# Patient Record
Sex: Female | Born: 1945 | Hispanic: Yes | Smoking: Never smoker
Health system: Southern US, Community
[De-identification: ages and names within clinical notes are randomized; demographics above are authoritative.]

## PROBLEM LIST (undated history)

## (undated) DIAGNOSIS — Z972 Presence of dental prosthetic device (complete) (partial): Secondary | ICD-10-CM

## (undated) DIAGNOSIS — I10 Essential (primary) hypertension: Secondary | ICD-10-CM

## (undated) DIAGNOSIS — R150 Incomplete defecation: Secondary | ICD-10-CM

## (undated) DIAGNOSIS — E119 Type 2 diabetes mellitus without complications: Secondary | ICD-10-CM

## (undated) DIAGNOSIS — E785 Hyperlipidemia, unspecified: Secondary | ICD-10-CM

## (undated) DIAGNOSIS — M1990 Unspecified osteoarthritis, unspecified site: Secondary | ICD-10-CM

## (undated) DIAGNOSIS — R32 Unspecified urinary incontinence: Secondary | ICD-10-CM

## (undated) DIAGNOSIS — F0390 Unspecified dementia without behavioral disturbance: Secondary | ICD-10-CM

## (undated) DIAGNOSIS — R1310 Dysphagia, unspecified: Secondary | ICD-10-CM

## (undated) HISTORY — DX: Incomplete defecation: R15.0

## (undated) HISTORY — DX: Dysphagia, unspecified: R13.10

## (undated) HISTORY — DX: Essential (primary) hypertension: I10

## (undated) HISTORY — DX: Unspecified urinary incontinence: R32

## (undated) HISTORY — DX: Unspecified osteoarthritis, unspecified site: M19.90

## (undated) HISTORY — PX: TUBAL LIGATION: SHX77

## (undated) HISTORY — DX: Hyperlipidemia, unspecified: E78.5

## (undated) HISTORY — DX: Type 2 diabetes mellitus without complications: E11.9

---

## 2004-11-06 ENCOUNTER — Ambulatory Visit: Payer: Self-pay

## 2005-08-22 ENCOUNTER — Ambulatory Visit: Payer: Self-pay | Admitting: Nurse Practitioner

## 2005-09-16 ENCOUNTER — Ambulatory Visit: Payer: Self-pay | Admitting: *Deleted

## 2006-04-20 ENCOUNTER — Emergency Department: Payer: Self-pay | Admitting: Emergency Medicine

## 2006-05-27 ENCOUNTER — Ambulatory Visit: Payer: Self-pay

## 2006-07-07 ENCOUNTER — Encounter: Payer: Self-pay | Admitting: *Deleted

## 2006-07-29 ENCOUNTER — Encounter: Payer: Self-pay | Admitting: *Deleted

## 2007-06-23 ENCOUNTER — Ambulatory Visit: Payer: Self-pay

## 2007-07-04 ENCOUNTER — Emergency Department: Payer: Self-pay | Admitting: Emergency Medicine

## 2007-08-16 ENCOUNTER — Emergency Department: Payer: Self-pay | Admitting: Emergency Medicine

## 2007-08-25 IMAGING — CR DG SHOULDER 3+V*R*
1 series · 3 of 3 positions shown · non-contrast
Comparison: none

REASON FOR EXAM: Fall
COMMENTS:   LMP: Post-Menopausal

PROCEDURE:     DXR - DXR SHOULDER RIGHT COMPLETE  - August 16, 2007 [DATE]
RESULT:     There is no evidence of fracture, dislocation or radiopaque
foreign body. Degenerative changes are noted including the acromioclavicular
joint.

[Series 1: view not recorded · 0.17mm/px · 3 of 3 slices shown]
[im 1/3]
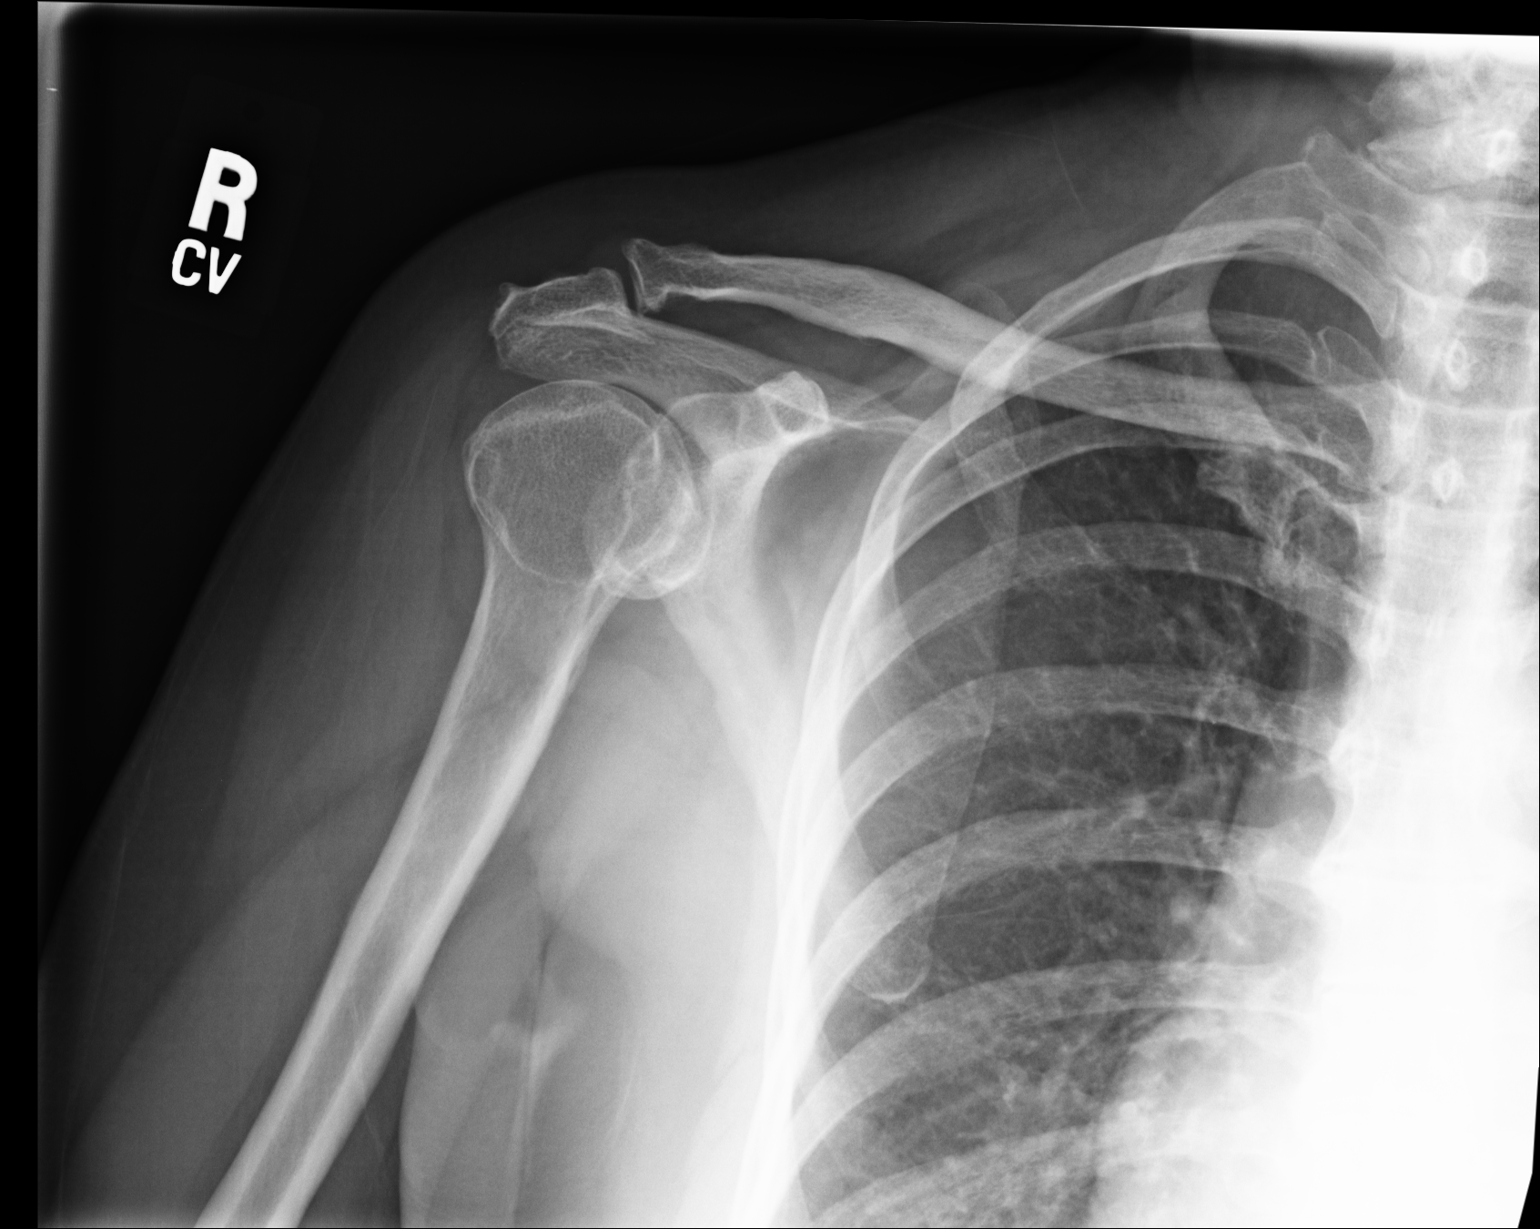
[im 2/3]
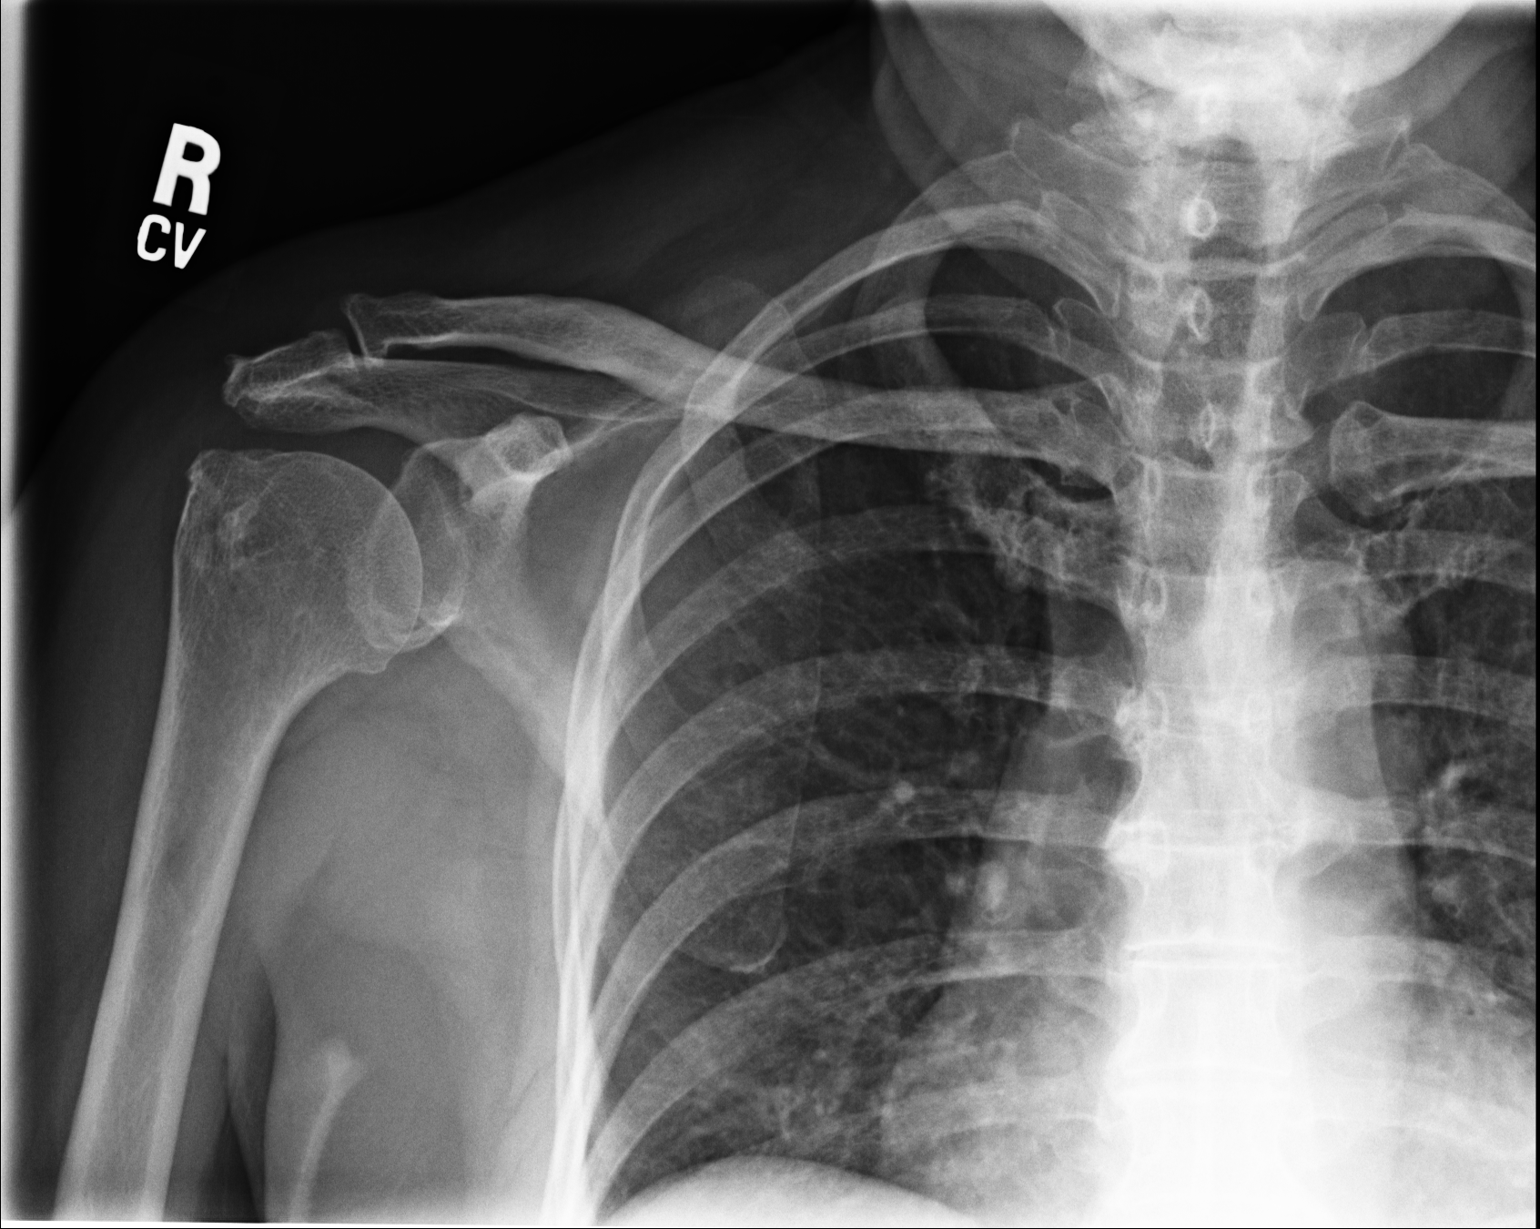
[im 3/3]
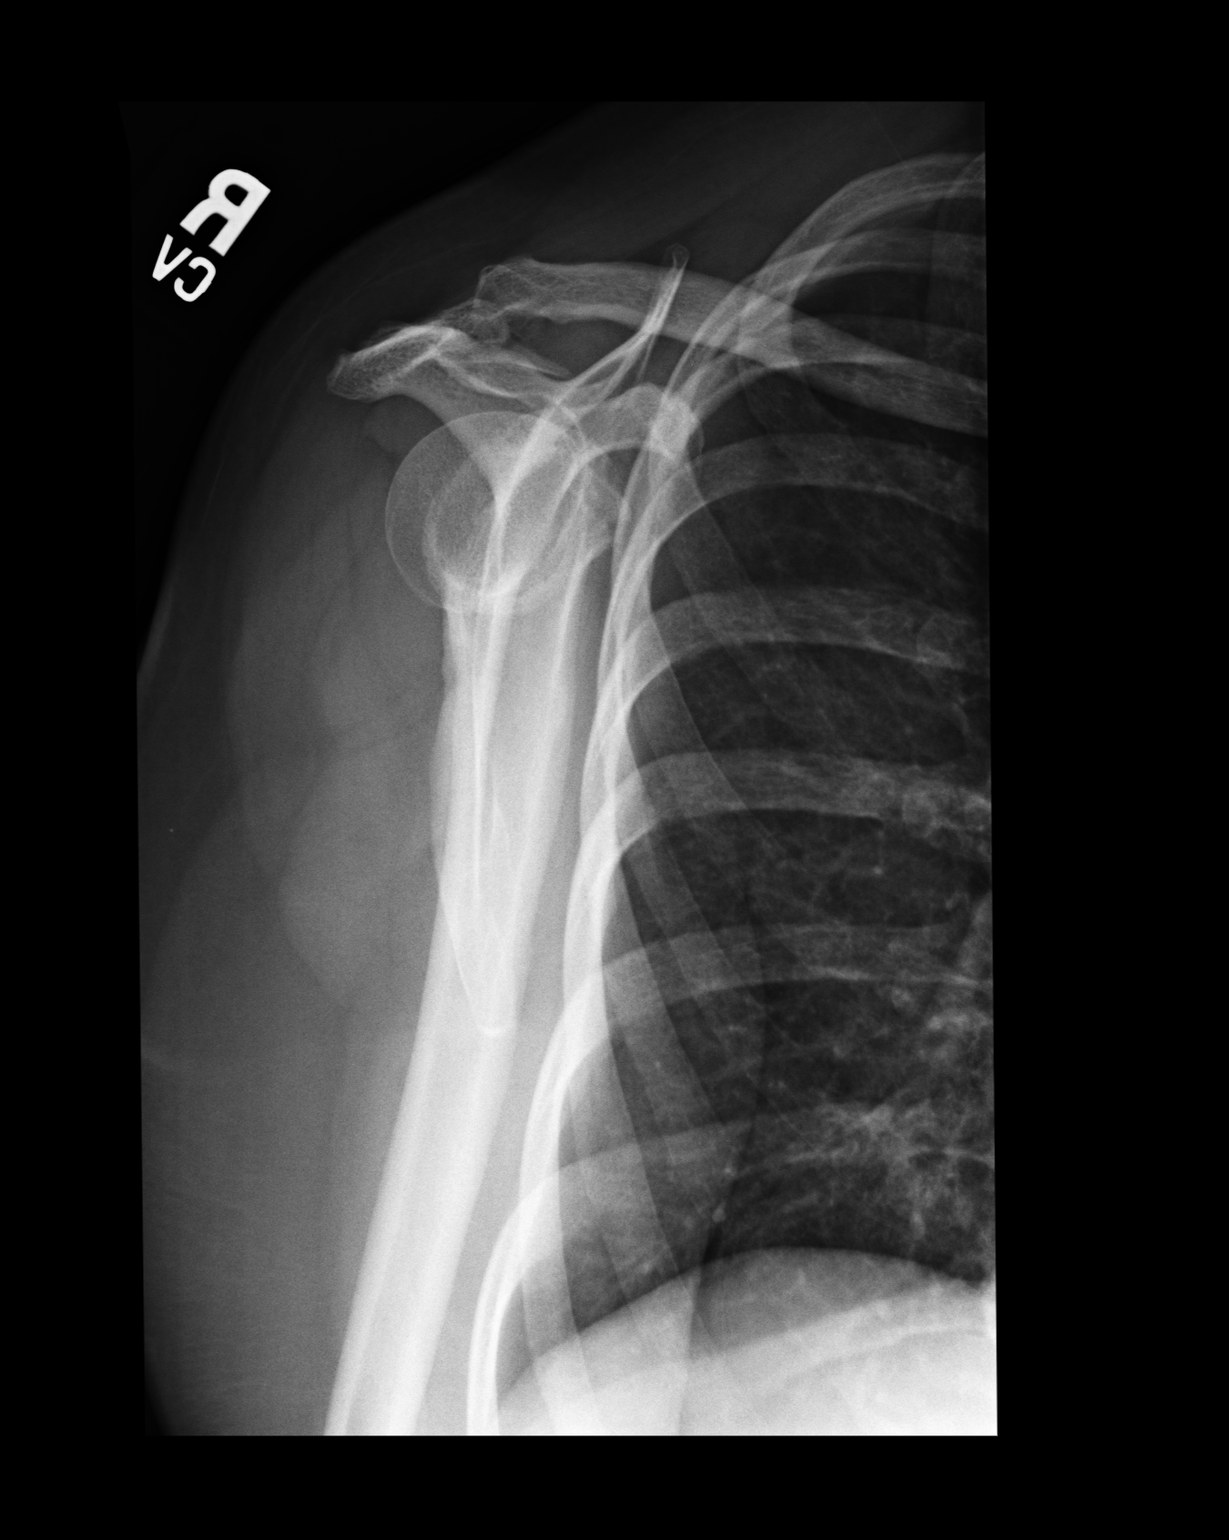

[3 of 3 positions shown; findings below may reference images not displayed]

IMPRESSION: 1.     No acute bony abnormality.
2.     Degenerative changes are noted.

## 2007-08-25 IMAGING — CR CERVICAL SPINE - 2-3 VIEW
1 series · 4 of 4 positions shown · non-contrast
Comparison: none

REASON FOR EXAM: Fall
COMMENTS:   LMP: Post-Menopausal

[Series 1: view not recorded · 0.17mm/px · 4 of 4 slices shown]
[im 1/4]
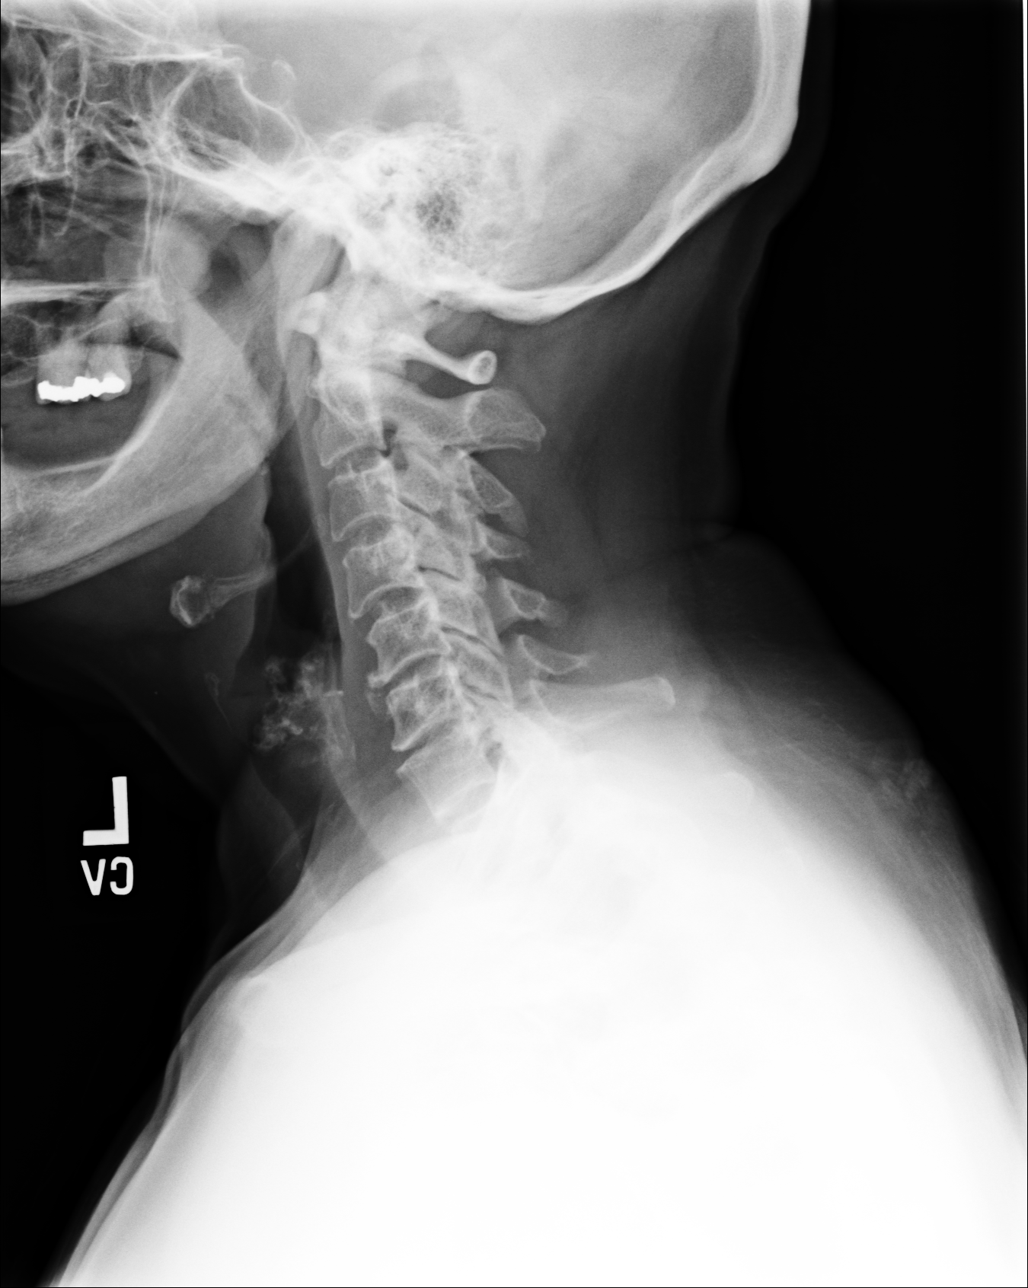
[im 2/4]
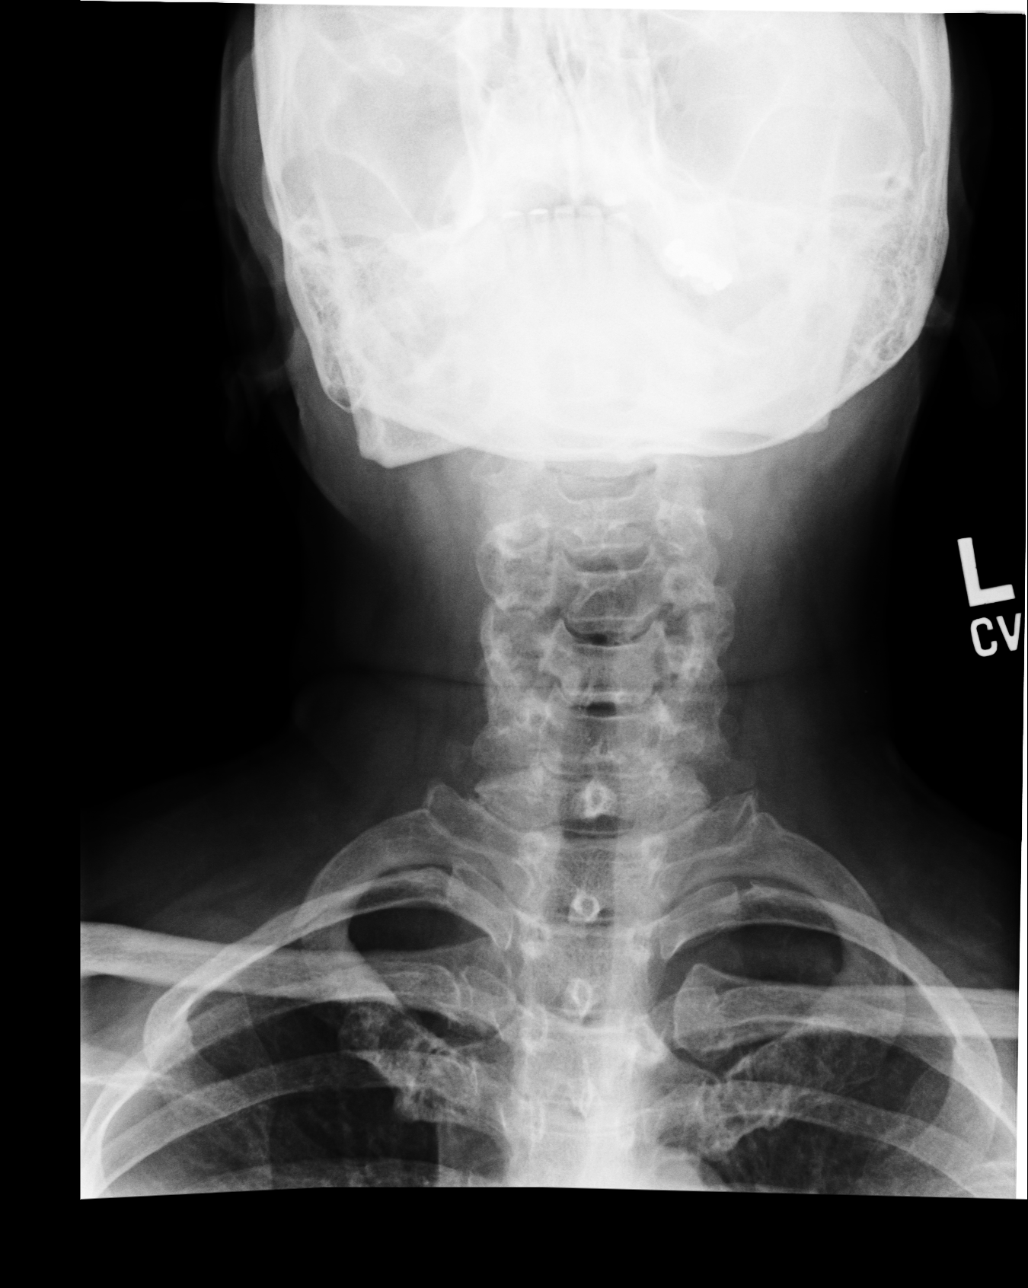
[im 3/4]
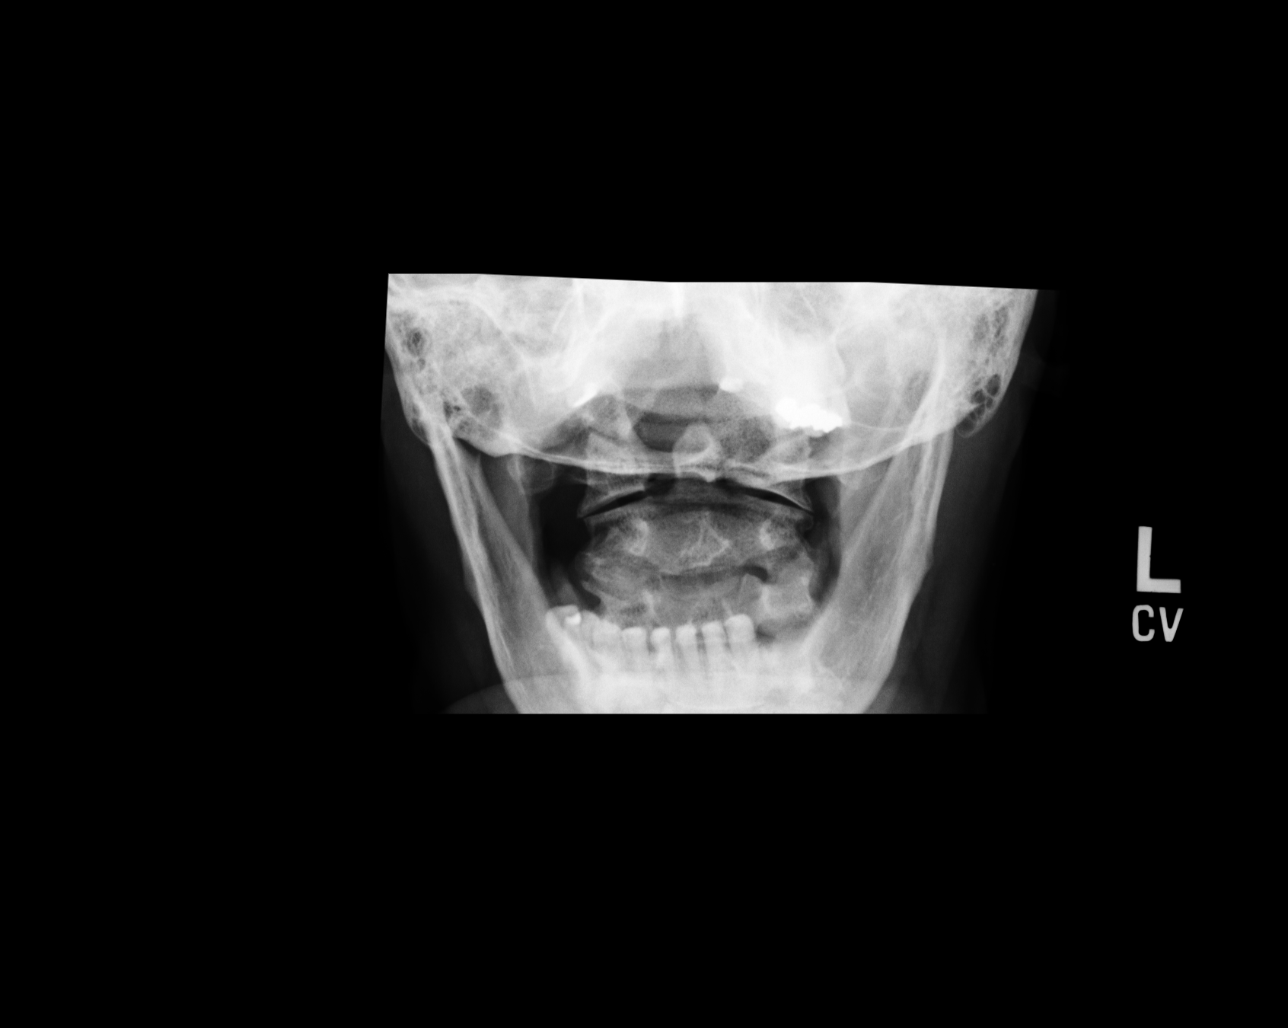
[im 4/4]
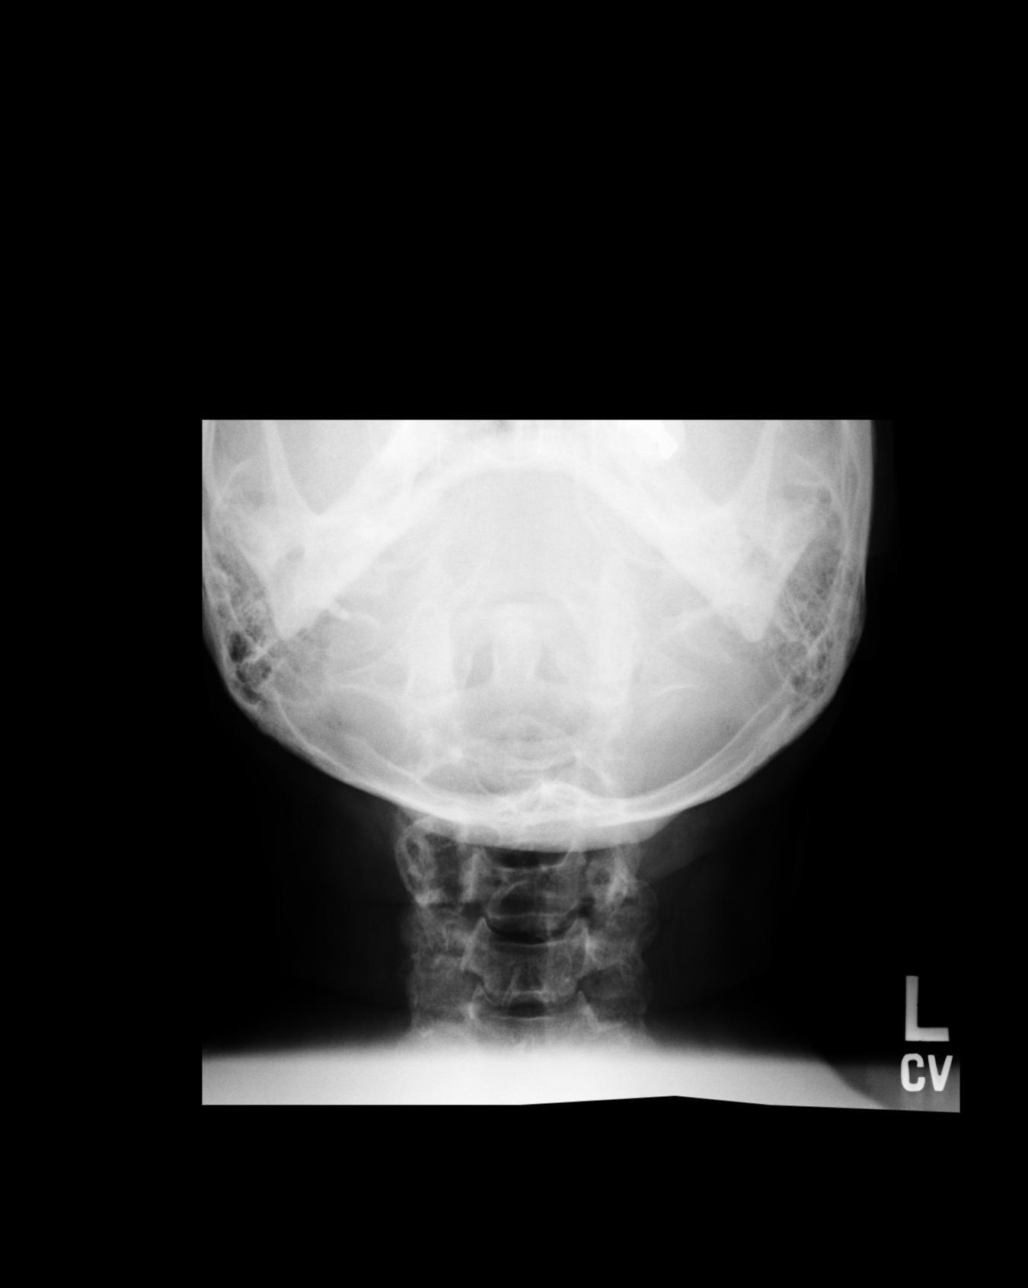

[4 of 4 positions shown; findings below may reference images not displayed]

PROCEDURE:     DXR - DXR C- SPINE AP AND LATERAL  - August 16, 2007 [DATE]

RESULT:     Comparison is made to images dated 08/22/2005.

There is degenerative intervertebral disc space narrowing at C5-6 and C6-7
with anterior end-plate spurring at these levels. The prevertebral soft
tissues are normal. The C1-C2 articulation and craniocervical junction
appear to be normal. The odontoid appears intact.
IMPRESSION: Degenerative changes are noted similar to that seen on
prior exam. No acute bony abnormality evident.

## 2008-03-16 ENCOUNTER — Ambulatory Visit: Payer: Self-pay | Admitting: Family Medicine

## 2008-11-26 ENCOUNTER — Emergency Department: Payer: Self-pay | Admitting: Emergency Medicine

## 2008-11-29 ENCOUNTER — Ambulatory Visit: Payer: Self-pay

## 2009-06-07 ENCOUNTER — Ambulatory Visit: Payer: Self-pay

## 2009-06-20 ENCOUNTER — Emergency Department: Payer: Self-pay | Admitting: Emergency Medicine

## 2009-06-24 ENCOUNTER — Emergency Department: Payer: Self-pay | Admitting: Emergency Medicine

## 2009-07-06 ENCOUNTER — Ambulatory Visit: Payer: Self-pay

## 2009-07-16 ENCOUNTER — Emergency Department: Payer: Self-pay | Admitting: Emergency Medicine

## 2010-08-05 ENCOUNTER — Ambulatory Visit: Payer: Self-pay | Admitting: Family Medicine

## 2010-10-09 ENCOUNTER — Ambulatory Visit: Payer: Self-pay | Admitting: Family Medicine

## 2011-08-07 ENCOUNTER — Ambulatory Visit: Payer: Self-pay | Admitting: Family Medicine

## 2012-09-03 ENCOUNTER — Ambulatory Visit: Payer: Self-pay | Admitting: Family Medicine

## 2013-11-02 ENCOUNTER — Ambulatory Visit: Payer: Self-pay | Admitting: Nurse Practitioner

## 2014-10-05 ENCOUNTER — Ambulatory Visit: Payer: Self-pay | Admitting: Nurse Practitioner

## 2014-11-06 ENCOUNTER — Ambulatory Visit: Payer: Self-pay | Admitting: Family Medicine

## 2015-02-28 DIAGNOSIS — I340 Nonrheumatic mitral (valve) insufficiency: Secondary | ICD-10-CM

## 2015-02-28 HISTORY — DX: Nonrheumatic mitral (valve) insufficiency: I34.0

## 2015-12-07 DIAGNOSIS — I10 Essential (primary) hypertension: Secondary | ICD-10-CM

## 2015-12-07 HISTORY — DX: Essential (primary) hypertension: I10

## 2016-02-12 ENCOUNTER — Emergency Department: Payer: Medicare Other

## 2016-02-12 ENCOUNTER — Emergency Department
Admission: EM | Admit: 2016-02-12 | Discharge: 2016-02-12 | Disposition: A | Discharge status: Home / Self Care | Payer: Medicare Other | Attending: Emergency Medicine | Admitting: Emergency Medicine

## 2016-02-12 DIAGNOSIS — R05 Cough: Secondary | ICD-10-CM

## 2016-02-12 DIAGNOSIS — Z79899 Other long term (current) drug therapy: Secondary | ICD-10-CM | POA: Diagnosis not present

## 2016-02-12 DIAGNOSIS — Z794 Long term (current) use of insulin: Secondary | ICD-10-CM | POA: Insufficient documentation

## 2016-02-12 DIAGNOSIS — R059 Cough, unspecified: Secondary | ICD-10-CM

## 2016-02-12 DIAGNOSIS — R109 Unspecified abdominal pain: Secondary | ICD-10-CM

## 2016-02-12 DIAGNOSIS — R1011 Right upper quadrant pain: Secondary | ICD-10-CM

## 2016-02-12 DIAGNOSIS — Z7982 Long term (current) use of aspirin: Secondary | ICD-10-CM | POA: Insufficient documentation

## 2016-02-12 LAB — URINALYSIS COMPLETE WITH MICROSCOPIC (ARMC ONLY)
BILIRUBIN URINE: NEGATIVE
Bacteria, UA: NONE SEEN
Glucose, UA: NEGATIVE mg/dL
KETONES UR: NEGATIVE mg/dL
Leukocytes, UA: NEGATIVE
NITRITE: NEGATIVE
Protein, ur: 30 mg/dL — AB
SPECIFIC GRAVITY, URINE: 1.024 (ref 1.005–1.030)
pH: 5 (ref 5.0–8.0)

## 2016-02-12 LAB — CBC WITH DIFFERENTIAL/PLATELET
BASOS ABS: 0.1 10*3/uL (ref 0–0.1)
BASOS PCT: 1 %
EOS ABS: 0.4 10*3/uL (ref 0–0.7)
Eosinophils Relative: 6 %
HCT: 39.4 % (ref 35.0–47.0)
HEMOGLOBIN: 13.1 g/dL (ref 12.0–16.0)
Lymphocytes Relative: 26 %
Lymphs Abs: 1.7 10*3/uL (ref 1.0–3.6)
MCH: 30.1 pg (ref 26.0–34.0)
MCHC: 33.2 g/dL (ref 32.0–36.0)
MCV: 90.9 fL (ref 80.0–100.0)
MONOS PCT: 8 %
Monocytes Absolute: 0.5 10*3/uL (ref 0.2–0.9)
NEUTROS ABS: 3.9 10*3/uL (ref 1.4–6.5)
NEUTROS PCT: 59 %
Platelets: 239 10*3/uL (ref 150–440)
RBC: 4.34 MIL/uL (ref 3.80–5.20)
RDW: 14 % (ref 11.5–14.5)
WBC: 6.6 10*3/uL (ref 3.6–11.0)

## 2016-02-12 LAB — COMPREHENSIVE METABOLIC PANEL
ALK PHOS: 122 U/L (ref 38–126)
ALT: 73 U/L — AB (ref 14–54)
ANION GAP: 5 (ref 5–15)
AST: 72 U/L — ABNORMAL HIGH (ref 15–41)
Albumin: 4.3 g/dL (ref 3.5–5.0)
BILIRUBIN TOTAL: 0.7 mg/dL (ref 0.3–1.2)
BUN: 13 mg/dL (ref 6–20)
CALCIUM: 8.4 mg/dL — AB (ref 8.9–10.3)
CO2: 26 mmol/L (ref 22–32)
CREATININE: 0.47 mg/dL (ref 0.44–1.00)
Chloride: 101 mmol/L (ref 101–111)
Glucose, Bld: 157 mg/dL — ABNORMAL HIGH (ref 65–99)
Potassium: 3.6 mmol/L (ref 3.5–5.1)
SODIUM: 132 mmol/L — AB (ref 135–145)
TOTAL PROTEIN: 8.1 g/dL (ref 6.5–8.1)

## 2016-02-12 LAB — LIPASE, BLOOD: LIPASE: 30 U/L (ref 11–51)

## 2016-02-12 MED ORDER — KETOROLAC TROMETHAMINE 30 MG/ML IJ SOLN
15.0000 mg | Freq: Once | INTRAMUSCULAR | Status: AC
  Administered 2016-02-12: 15 mg via INTRAVENOUS
  Filled 2016-02-12: qty 1

## 2016-02-12 MED ORDER — MORPHINE SULFATE (PF) 4 MG/ML IV SOLN
4.0000 mg | Freq: Once | INTRAVENOUS | Status: AC
  Administered 2016-02-12: 4 mg via INTRAVENOUS
  Filled 2016-02-12: qty 1

## 2016-02-12 MED ORDER — ONDANSETRON HCL 4 MG/2ML IJ SOLN
4.0000 mg | Freq: Once | INTRAMUSCULAR | Status: AC
  Administered 2016-02-12: 4 mg via INTRAVENOUS
  Filled 2016-02-12: qty 2

## 2016-02-12 MED ORDER — AZITHROMYCIN 250 MG PO TABS: ORAL_TABLET | ORAL | Status: AC

## 2016-02-12 MED ORDER — OXYCODONE-ACETAMINOPHEN 5-325 MG PO TABS: 1.0000 | ORAL_TABLET | Freq: Four times a day (QID) | ORAL | Status: DC | PRN

## 2016-02-12 MED ORDER — SODIUM CHLORIDE 0.9 % IV BOLUS (SEPSIS)
500.0000 mL | Freq: Once | INTRAVENOUS | Status: AC
  Administered 2016-02-12: 500 mL via INTRAVENOUS

## 2016-02-12 MED ORDER — NAPROXEN 250 MG PO TABS: 250.0000 mg | ORAL_TABLET | Freq: Two times a day (BID) | ORAL | Status: DC

## 2016-02-12 NOTE — ED Provider Notes (Addendum)
[facility] Emergency Department Provider Note  ____________________________________________   I have reviewed the triage vital signs and the nursing notes.   HISTORY  Chief Complaint Flank Pain    HPI Holly Schneider is a 70 y.o. female with a history of diabetes and high blood pressure, history is per patient with the help of the interpreter, patient has developed right-sided flank pain since yesterday. No abdominal pain. No nausea no vomiting. Her only other complaint is a cough. She's had this for a few days as well. She denies fever or chills. She denies dysuria or urinary frequency. She denies constipation or diarrhea. She states the pain began gradually last night. She does not have a history of kidney stones. Nothing makes it better nothing makes it worse. She does not believe it to be related to her coughing. It is below her ribs. She has no abdominal pain.  No past medical history on file.  There are no active problems to display for this patient.   No past surgical history on file.  Current Outpatient Rx  Name  Route  Sig  Dispense  Refill  . aspirin EC 81 MG tablet   Oral   Take 81 mg by mouth daily.          Marland Kitchen LANTUS SOLOSTAR 100 UNIT/ML Solostar Pen   Subcutaneous   Inject 16 Units into the skin at bedtime.            Dispense as written.   Marland Kitchen lisinopril (PRINIVIL,ZESTRIL) 20 MG tablet   Oral   Take 20 mg by mouth daily.          Marland Kitchen azithromycin (ZITHROMAX Z-PAK) 250 MG tablet      Take 2 tablets (500 mg) on  Day 1,  followed by 1 tablet (250 mg) once daily on Days 2 through 5.   6 each   0   . naproxen (NAPROSYN) 250 MG tablet   Oral   Take 1 tablet (250 mg total) by mouth 2 (two) times daily with a meal.   10 tablet   0   . oxyCODONE-acetaminophen (ROXICET) 5-325 MG tablet   Oral   Take 1 tablet by mouth every 6 (six) hours as needed.   8 tablet   0     Allergies Review of patient's allergies indicates no known  allergies.  No family history on file.  Social History Social History  Substance Use Topics  . Smoking status: Not on file  . Smokeless tobacco: Not on file  . Alcohol Use: Not on file    Review of Systems Constitutional: No fever/chills Eyes: No visual changes. ENT: No sore throat. No stiff neck no neck pain Cardiovascular: Denies chest pain. Respiratory: Denies shortness of breath. Positive for cough Gastrointestinal:   no vomiting.  No diarrhea.  No constipation. Genitourinary: Negative for dysuria. Musculoskeletal: Negative lower extremity swelling Skin: Negative for rash. Neurological: Negative for headaches, focal weakness or numbness. 10-point ROS otherwise negative.  ____________________________________________   PHYSICAL EXAM:  VITAL SIGNS: ED Triage Vitals  Enc Vitals Group     BP 02/12/16 0719 145/70 mmHg     Pulse Rate 02/12/16 0719 62     Resp 02/12/16 0719 18     Temp 02/12/16 0719 98 F (36.7 C)     Temp Source 02/12/16 0719 Oral     SpO2 02/12/16 0719 99 %     Weight 02/12/16 0719 120 lb (54.432 kg)     Height 02/12/16  0719 4\' 11"  (1.499 m)     Head Cir --      Peak Flow --      Pain Score 02/12/16 0720 8     Pain Loc --      Pain Edu? --      Excl. in GC? --     Constitutional: Alert and oriented. Well appearing and in no acute distress. Eyes: Conjunctivae are normal. PERRL. EOMI. Head: Atraumatic. Nose: No congestion/rhinnorhea. Mouth/Throat: Mucous membranes are moist.  Oropharynx non-erythematous. Neck: No stridor.   Nontender with no meningismus Cardiovascular: Normal rate, regular rhythm. Grossly normal heart sounds.  Good peripheral circulation. Respiratory: Normal respiratory effort.  No retractions. Lungs CTAB. Abdominal: Soft and nontender. No distention. No guarding no rebound Back:  There is no focal tenderness or step off there is no midline tenderness there are no lesions noted. there is right CVA tenderness Musculoskeletal: No  lower extremity tenderness. No joint effusions, no DVT signs strong distal pulses no edema Neurologic:  Normal speech and language. No gross focal neurologic deficits are appreciated.  Skin:  Skin is warm, dry and intact. No rash noted. Psychiatric: Mood and affect are normal. Speech and behavior are normal.  ____________________________________________   LABS (all labs ordered are listed, but only abnormal results are displayed)  Labs Reviewed  COMPREHENSIVE METABOLIC PANEL - Abnormal; Notable for the following:    Sodium 132 (*)    Glucose, Bld 157 (*)    Calcium 8.4 (*)    AST 72 (*)    ALT 73 (*)    All other components within normal limits  URINALYSIS COMPLETEWITH MICROSCOPIC ([facility] ONLY) - Abnormal; Notable for the following:    Color, Urine YELLOW (*)    APPearance CLEAR (*)    Hgb urine dipstick 2+ (*)    Protein, ur 30 (*)    Squamous Epithelial / LPF 0-5 (*)    All other components within normal limits  URINE CULTURE  CBC WITH DIFFERENTIAL/PLATELET  LIPASE, BLOOD   ____________________________________________  EKG  I personally interpreted any EKGs ordered by me or triage  ____________________________________________  RADIOLOGY  I reviewed any imaging ordered by me or triage that were performed during my shift ____________________________________________   PROCEDURES  Procedure(s) performed: None  Critical Care performed: None  ____________________________________________   INITIAL IMPRESSION / ASSESSMENT AND PLAN / ED COURSE  Pertinent labs & imaging results that were available during my care of the patient were reviewed by me and considered in my medical decision making (see chart for details).  Well-appearing woman with reproducible right flank pain that does seem to be either muscular skeletal or possibly renal in origin. There is no significant abdominal pain. There is no significant rib pain noted. Differential could certainly be muscular  skeletal injury from coughing, kidney stone is possible pyelonephritis is possible. Lower suspicion for pneumonia or other primary pulmonary pathology as a direct cause of this pain however we will obtain a chest x-ray and I'll obtain a CT scan of that area.  ----------------------------------------- 9:32 AM on 02/12/2016 -----------------------------------------  Patient does have a mild bump in her LFTs, and there is a gallstone noted on CT, however, patient does not have any significant right upper quadrant pain. She still has a new palpation in the right flank. Most likely I feel this is possibly musculoskeletal from her cough there is no evidence of pneumonia, do not believe she has a PE. Very reproducible pain. However, she does have hematuria she could've passed  a small kidney stone as well. Our workup is very reassuring in terms of acute life-threatening disease processes at this time. We will get a ultrasound of her gallbladder although she does not have significant tenderness given her elevation in liver function tests and her possible gallstone. Sometimes she can have referred pain but again this is quite reproducible.  ----------------------------------------- 12:30 PM on 02/12/2016 ----------------------------------------- Patient remains very well-appearing. The only time she has pain is when she changes position. She states she believes that she hurt herself coughing at this time there is nothing to indicate that is not true. I do not think a CTA of the chest is indicated for this patient at this time. I do not believe this is a PE. His very reproducible muscular skeletal pain without any other obvious pathology noted on extensive encumbrance of workup. She is very comfortable especially after Toradol unless she moves. Patient prefer to go home at this time. She does continue to have a slight cough that there is no evidence of pneumonia on CT scan to the extent that her bases are  appreciated nor is there any on chest x-ray. Given her age however and her significant cough I may consider antibiotics for her and pain medication. Return precautions and follow-up given and understood. No evidence of broken rib clinically. A shin is able to take deep breath without any significant difficulty. No evidence of splinting.  ____________________________________________   FINAL CLINICAL IMPRESSION(S) / ED DIAGNOSES  Final diagnoses:  Flank pain  RUQ pain  Cough      This chart was dictated using voice recognition software.  Despite best efforts to proofread,  errors can occur which can change meaning.     Jeanmarie Plant, MD 02/12/16 2130  Jeanmarie Plant, MD 02/12/16 [id number]  Jeanmarie Plant, MD 02/12/16 1233  Jeanmarie Plant, MD 02/12/16 [phone]

## 2016-02-12 NOTE — Discharge Instructions (Signed)
Dolor abdominal en adultos °(Abdominal Pain, Adult) °El dolor puede tener muchas causas. Normalmente la causa del dolor abdominal no es una enfermedad y mejorará sin tratamiento. Frecuentemente puede controlarse y tratarse en casa. Su médico le realizará un examen físico y posiblemente solicite análisis de sangre y radiografías para ayudar a determinar la gravedad de su dolor. Sin embargo, en muchos casos, debe transcurrir más tiempo antes de que se pueda encontrar una causa evidente del dolor. Antes de llegar a ese punto, es posible que su médico no sepa si necesita más pruebas o un tratamiento más profundo. °INSTRUCCIONES PARA EL CUIDADO EN EL HOGAR  °Esté atento al dolor para ver si hay cambios. Las siguientes indicaciones ayudarán a aliviar cualquier molestia que pueda sentir: °· Tome solo medicamentos de venta libre o recetados, según las indicaciones del médico. °· No tome laxantes a menos que se lo haya indicado su médico. °· Pruebe con una dieta líquida absoluta (caldo, té o agua) según se lo indique su médico. Introduzca gradualmente una dieta normal, según su tolerancia. °SOLICITE ATENCIÓN MÉDICA SI: °· Tiene dolor abdominal sin explicación. °· Tiene dolor abdominal relacionado con náuseas o diarrea. °· Tiene dolor cuando orina o defeca. °· Experimenta dolor abdominal que lo despierta de noche. °· Tiene dolor abdominal que empeora o mejora cuando come alimentos. °· Tiene dolor abdominal que empeora cuando come alimentos grasosos. °· Tiene fiebre. °SOLICITE ATENCIÓN MÉDICA DE INMEDIATO SI:  °· El dolor no desaparece en un plazo máximo de 2 horas. °· No deja de (vomitar). °· El dolor se siente solo en partes del abdomen, como el lado derecho o la parte inferior izquierda del abdomen. °· Evacúa materia fecal sanguinolenta o negra, de aspecto alquitranado. °ASEGÚRESE DE QUE: °· Comprende estas instrucciones. °· Controlará su afección. °· Recibirá ayuda de inmediato si no mejora o si empeora. °  °Esta  información no tiene como fin reemplazar el consejo del médico. Asegúrese de hacerle al médico cualquier pregunta que tenga. °  °Document Released: 12/15/2005 Document Revised: 01/05/2015 °Elsevier Interactive Patient Education ©2016 [employer] ° °

## 2016-02-12 NOTE — ED Notes (Signed)
Discussed discharge instructions, prescriptions, and follow-up care with patient. No questions or concerns at this time. Pt stable at discharge. Spanish interpretation provided by Wilford Corner.

## 2016-02-12 NOTE — ED Notes (Addendum)
Pt to ED c/o R flank pain 8/10, tender to palpation. No n/v/d. No c/o dysuria. Assessment done via spanish interpreter [employer].

## 2016-02-12 NOTE — ED Notes (Signed)
Pt reports she began yesterday having rt sided flank pain. Denies dysuria, denies nvd. No fever

## 2016-02-13 LAB — URINE CULTURE

## 2016-04-03 ENCOUNTER — Other Ambulatory Visit: Payer: Self-pay | Admitting: Family Medicine

## 2016-04-03 DIAGNOSIS — Z780 Asymptomatic menopausal state: Secondary | ICD-10-CM

## 2016-04-03 DIAGNOSIS — Z1231 Encounter for screening mammogram for malignant neoplasm of breast: Secondary | ICD-10-CM

## 2016-05-29 ENCOUNTER — Telehealth: Payer: Self-pay | Admitting: Urology

## 2016-05-29 ENCOUNTER — Encounter: Payer: Self-pay | Admitting: Urology

## 2016-05-29 ENCOUNTER — Ambulatory Visit (INDEPENDENT_AMBULATORY_CARE_PROVIDER_SITE_OTHER): Payer: Medicare Other | Admitting: Urology

## 2016-05-29 VITALS — BP 133/74 | HR 76 | Ht <= 58 in | Wt 120.2 lb

## 2016-05-29 DIAGNOSIS — R351 Nocturia: Secondary | ICD-10-CM

## 2016-05-29 DIAGNOSIS — R32 Unspecified urinary incontinence: Secondary | ICD-10-CM

## 2016-05-29 DIAGNOSIS — R350 Frequency of micturition: Secondary | ICD-10-CM

## 2016-05-29 DIAGNOSIS — N952 Postmenopausal atrophic vaginitis: Secondary | ICD-10-CM

## 2016-05-29 DIAGNOSIS — IMO0001 Reserved for inherently not codable concepts without codable children: Secondary | ICD-10-CM

## 2016-05-29 DIAGNOSIS — R310 Gross hematuria: Secondary | ICD-10-CM

## 2016-05-29 LAB — MICROSCOPIC EXAMINATION: BACTERIA UA: NONE SEEN

## 2016-05-29 LAB — URINALYSIS, COMPLETE
Bilirubin, UA: NEGATIVE
GLUCOSE, UA: NEGATIVE
KETONES UA: NEGATIVE
NITRITE UA: NEGATIVE
Protein, UA: NEGATIVE
SPEC GRAV UA: 1.015 (ref 1.005–1.030)
Urobilinogen, Ur: 0.2 mg/dL (ref 0.2–1.0)
pH, UA: 5.5 (ref 5.0–7.5)

## 2016-05-29 LAB — BLADDER SCAN AMB NON-IMAGING: Scan Result: 35

## 2016-05-29 MED ORDER — ESTROGENS, CONJUGATED 0.625 MG/GM VA CREA: 1.0000 | TOPICAL_CREAM | Freq: Every day | VAGINAL | Status: DC

## 2016-05-29 MED ORDER — ESTRADIOL 0.1 MG/GM VA CREA: TOPICAL_CREAM | VAGINAL | Status: DC

## 2016-05-29 NOTE — Progress Notes (Signed)
05/29/2016 10:47 AM   Jacklynn Bue [birth year] [id number]  Referring provider: No referring provider defined for this encounter.  Chief Complaint  Patient presents with  . Urinary Incontinence    referred by [facility]    HPI: Patient is a 70 -year-old Hispanic female who presents today as a referral from their PCP, Dr. Celedonio Miyamoto , for mixed urinary incontinence.    She presents with an interpreter, Genella Rife.    Patient was found to have microscopic hematuria on today's visit with 3-10 RBC's/hpf.  Patient does have a prior history of gross hematuria about one month ago.  She does not have a prior history of recurrent urinary tract infections, nephrolithiasis, trauma to the genitourinary tract or malignancies of the genitourinary tract.   She does not have a family medical history of nephrolithiasis, malignancies of the genitourinary tract or hematuria.   Today, she is having symptoms of frequent urination (5 to 6 times a day) and nocturia (4 times a night).  She denies dysuria, incontinence, hesitancy, intermittency, straining to urinate or a weak urinary stream.    She is not experiencing any suprapubic pain, abdominal pain or flank pain. She denies any recent fevers, chills, nausea or vomiting.   She had a recent CT Renal Stone study on 02/12/2016 for right sided flank pain, but no renal calculi or obstructive changes are noted.  Right renal cyst.  No other focal abnormality is noted.  She is not a smoker.   She was exposed to secondhand smoke.  She has worked with Personnel officer.    PMH: Past Medical History  Diagnosis Date  . Arthritis   . Diabetes (HCC)   . HTN (hypertension)   . HLD (hyperlipidemia)   . Urinary incontinence     Surgical History: Past Surgical History  Procedure Laterality Date  . Tubal ligation      Home Medications:    Medication List       This list is accurate as of: 05/29/16 10:47 AM.  Always  use your most recent med list.               aspirin EC 81 MG tablet  Take 81 mg by mouth daily.     atorvastatin 40 MG tablet  Commonly known as:  LIPITOR     conjugated estrogens vaginal cream  Commonly known as:  PREMARIN  Place 1 Applicatorful vaginally daily. Apply 0.5mg  (pea-sized amount)  just inside the vaginal introitus with a finger-tip every night for two weeks and then Monday, Wednesday and Friday nights.     estradiol 0.1 MG/GM vaginal cream  Commonly known as:  ESTRACE VAGINAL  Apply 0.5mg  (pea-sized amount)  just inside the vaginal introitus with a finger-tip every night for two weeks and then Monday, Wednesday and Friday nights.     LANTUS SOLOSTAR 100 UNIT/ML Solostar Pen  Generic drug:  Insulin Glargine  Inject 16 Units into the skin at bedtime.     lisinopril 20 MG tablet  Commonly known as:  PRINIVIL,ZESTRIL  Take 20 mg by mouth daily.     metFORMIN 500 MG tablet  Commonly known as:  GLUCOPHAGE     naproxen 250 MG tablet  Commonly known as:  NAPROSYN  Take 1 tablet (250 mg total) by mouth 2 (two) times daily with a meal.     oxyCODONE-acetaminophen 5-325 MG tablet  Commonly known as:  ROXICET  Take 1 tablet by mouth every 6 (six) hours as needed.  Allergies: No Known Allergies  Family History: Family History  Problem Relation Age of Onset  . Kidney disease Neg Hx     Social History:  reports that she has never smoked. She does not have any smokeless tobacco history on file. She reports that she does not drink alcohol or use illicit drugs.  ROS: UROLOGY Frequent Urination?: Yes Hard to postpone urination?: Yes Burning/pain with urination?: No Get up at night to urinate?: Yes Leakage of urine?: No Urine stream starts and stops?: No Trouble starting stream?: No Do you have to strain to urinate?: No Blood in urine?: Yes Urinary tract infection?: No Sexually transmitted disease?: No Injury to kidneys or bladder?: No Painful  intercourse?: No Weak stream?: No Currently pregnant?: No Vaginal bleeding?: No Last menstrual period?: n  Gastrointestinal Nausea?: No Vomiting?: No Indigestion/heartburn?: No Diarrhea?: No Constipation?: No  Constitutional Fever: No Night sweats?: No Weight loss?: No Fatigue?: No  Skin Skin rash/lesions?: No Itching?: No  Eyes Blurred vision?: No Double vision?: No  Ears/Nose/Throat Sore throat?: No Sinus problems?: No  Hematologic/Lymphatic Swollen glands?: No Easy bruising?: No  Cardiovascular Leg swelling?: No Chest pain?: No  Respiratory Cough?: No Shortness of breath?: No  Endocrine Excessive thirst?: No  Musculoskeletal Back pain?: Yes Joint pain?: No  Neurological Headaches?: No Dizziness?: Yes  Psychologic Depression?: No Anxiety?: No  Physical Exam: BP 133/74 mmHg  Pulse 76  Ht 4\' 9"  (1.448 m)  Wt 120 lb 3.2 oz (54.522 kg)  BMI 26.00 kg/m2  Constitutional: Well nourished. Alert and oriented, No acute distress. HEENT: [place] AT, moist mucus membranes. Trachea midline, no masses. Cardiovascular: No clubbing, cyanosis, or edema. Respiratory: Normal respiratory effort, no increased work of breathing. GI: Abdomen is soft, non tender, non distended, no abdominal masses. Liver and spleen not palpable.  No hernias appreciated.  Stool sample for occult testing is not indicated.   GU: No CVA tenderness.  No bladder fullness or masses.  Atrophic external genitalia, normal pubic hair distribution, no lesions.  Normal urethral meatus, no lesions, no prolapse, no discharge.   No urethral masses, tenderness and/or tenderness. No bladder fullness, tenderness or masses. Normal vagina mucosa, good estrogen effect, no discharge, no lesions, good pelvic support, Grade II cystocele is noted.  No rectocele is noted.  No cervical motion tenderness.  Uterus is freely mobile and non-fixed.  No adnexal/parametria masses or tenderness noted.  Anus and perineum are  without rashes or lesions. Skin: No rashes, bruises or suspicious lesions. Lymph: No cervical or inguinal adenopathy. Neurologic: Grossly intact, no focal deficits, moving all 4 extremities. Psychiatric: Normal mood and affect.  Laboratory Data: Lab Results  Component Value Date   WBC 6.6 02/12/2016   HGB 13.1 02/12/2016   HCT 39.4 02/12/2016   MCV 90.9 02/12/2016   PLT 239 02/12/2016    Lab Results  Component Value Date   CREATININE 0.47 02/12/2016    Lab Results  Component Value Date   AST 72* 02/12/2016   Lab Results  Component Value Date   ALT 73* 02/12/2016      Urinalysis Results for orders placed or performed in visit on 05/29/16  CULTURE, URINE COMPREHENSIVE  Result Value Ref Range   Urine Culture, Comprehensive Final report    Result 1 Comment   Microscopic Examination  Result Value Ref Range   WBC, UA 6-10 (A) 0 -  5 /hpf   RBC, UA 3-10 (A) 0 -  2 /hpf   Epithelial Cells (non renal) 0-10 0 -  10 /hpf   Bacteria, UA None seen None seen/Few  Urinalysis, Complete  Result Value Ref Range   Specific Gravity, UA 1.015 1.005 - 1.030   pH, UA 5.5 5.0 - 7.5   Color, UA Yellow Yellow   Appearance Ur Clear Clear   Leukocytes, UA Trace (A) Negative   Protein, UA Negative Negative/Trace   Glucose, UA Negative Negative   Ketones, UA Negative Negative   RBC, UA 2+ (A) Negative   Bilirubin, UA Negative Negative   Urobilinogen, Ur 0.2 0.2 - 1.0 mg/dL   Nitrite, UA Negative Negative   Microscopic Examination See below:   BUN+Creat  Result Value Ref Range   BUN 13 8 - 27 mg/dL   Creatinine, Ser 6.96 0.57 - 1.00 mg/dL   GFR calc non Af Amer 94 >59 mL/min/1.73   GFR calc Af Amer 108 >59 mL/min/1.73   BUN/Creatinine Ratio 22 12 - 28  BLADDER SCAN AMB NON-IMAGING  Result Value Ref Range   Scan Result 35     Pertinent Imaging: CLINICAL DATA: Right flank pain for 1 day  EXAM: CT ABDOMEN AND PELVIS WITHOUT CONTRAST  TECHNIQUE: Multidetector CT imaging of  the abdomen and pelvis was performed following the standard protocol without IV contrast.  COMPARISON: None.  FINDINGS: Lung bases are free of acute infiltrate or sizable effusion.  The liver, spleen, adrenal glands and pancreas are within normal limits. The gallbladder is well distended. A single dependent density is noted likely representing small gallstone. No complicating factors are seen. The kidneys are well visualized bilaterally and reveal no renal calculi. A hypodensity is noted in the medial aspect of the right kidney likely representing a cyst. No obstructive changes are noted.  The appendix is within normal limits. The bladder is partially distended. No pelvic mass lesion is noted. Diverticular change without diverticulitis is noted. The osseous structures are within normal limits.  IMPRESSION: No renal calculi or obstructive changes are noted.  Right renal cyst.  No other focal abnormality is noted.   Electronically Signed  By: Alcide Clever M.D.  On: 02/12/2016 08:26  Results for MARIZOL, BORROR (MRN [id number]) as of 05/29/2016 09:45  Ref. Range 05/29/2016 09:20  Scan Result Unknown 35   Assessment & Plan:    1. Gross hematuria:    I explained to the patient that there are a number of causes that can be associated with blood in the urine, such as stones, UTI's, damage to the urinary tract and/or cancer.  At this time, I felt that the patient warranted further urologic evaluation.   The AUA guidelines state that a CT urogram is the preferred imaging study to evaluate hematuria.  I explained to the patient that a contrast material will be injected into a vein and that in rare instances, an allergic reaction can result and may even life threatening   The patient denies any allergies to contrast, iodine and/or seafood and is taking metformin.  Her reproductive status is status post menopausal and she underwent a tubal ligation.   Following the  imaging study,  I've recommended a cystoscopy. I described how this is performed, typically in an office setting with a flexible cystoscope. We described the risks, benefits, and possible side effects, the most common of which is a minor amount of blood in the urine and/or burning which usually resolves in 24 to 48 hours. I also gave the patient the option of sending a urine for cytologic evaluation.  The patient had the opportunity to ask  questions which were answered. Based upon this discussion, the patient is willing to proceed. Therefore, I've ordered: a CT Urogram with  and cystoscopy.  She will return following all of the above for discussion of the results.     - Urinalysis, Complete - CULTURE, URINE COMPREHENSIVE  2. Urinary frequency:  If no etiology is found for her hematuria or frequency during her work up, we will reexamine this issue.    - BLADDER SCAN AMB NON-IMAGING  3. Urinary nocturia:  If no etiology is found for her hematuria or nocturia during her work up, we will reexamine this issue.    4. Vaginal atrophy:   I explained to the patient that when women go through menopause and her estrogen levels are severely diminished, the normal vaginal flora will change.  This is due to an increase of the vaginal canal's pH. Because of this, the vaginal canal may be colonized by bacteria from the rectum instead of the protective lactobacillus.  This accompanied by the loss of the mucus barrier with vaginal atrophy is a cause of recurrent urinary tract infections.  It may also cause the irritative bladder symptoms she is experiencing.    Patient was given a sample of vaginal estrogen cream (Estrace) and instructed to apply 0.5mg  (pea-sized amount)  just inside the vaginal introitus with a finger-tip every night for two weeks and then Monday, Wednesday and Friday nights.  I explained to the patient that vaginally administered estrogen, which causes only a slight increase in the blood estrogen  levels, have fewer contraindications and adverse systemic effects that oral HT.  I have also given prescriptions for the Estrace cream and Premarin cream, so that the patient may carry them to the pharmacy to see which one of the branded creams would be most economical for her.  She will have this readdressed when she completes her hematuria work up.    Return for CT Urogram report and cystoscopy.  These notes generated with voice recognition software. I apologize for typographical errors.  Michiel Cowboy, PA-C  [facility] [address], [address] [place], [place] [address] [phone]

## 2016-05-29 NOTE — Telephone Encounter (Signed)
Would you please send a copy of this office visit to the patient's referring provider, Dr. Celedonio MiyamotoAndrian Mancheno?

## 2016-05-30 LAB — BUN+CREAT
BUN/Creatinine Ratio: 22 (ref 12–28)
BUN: 13 mg/dL (ref 8–27)
CREATININE: 0.58 mg/dL (ref 0.57–1.00)
GFR, EST AFRICAN AMERICAN: 108 mL/min/{1.73_m2} (ref >59)
GFR, EST NON AFRICAN AMERICAN: 94 mL/min/{1.73_m2} (ref >59)

## 2016-05-30 NOTE — Telephone Encounter (Signed)
Done ° ° °Holly Schneider °

## 2016-05-31 LAB — CULTURE, URINE COMPREHENSIVE

## 2016-06-02 ENCOUNTER — Ambulatory Visit: Payer: Medicare Other | Attending: Family Medicine

## 2016-06-02 ENCOUNTER — Other Ambulatory Visit: Payer: Medicare Other

## 2016-06-03 DIAGNOSIS — N952 Postmenopausal atrophic vaginitis: Secondary | ICD-10-CM

## 2016-06-03 HISTORY — DX: Postmenopausal atrophic vaginitis: N95.2

## 2016-06-17 ENCOUNTER — Ambulatory Visit (INDEPENDENT_AMBULATORY_CARE_PROVIDER_SITE_OTHER): Payer: Medicare Other | Admitting: Urology

## 2016-06-17 VITALS — BP 122/70 | HR 73 | Ht <= 58 in | Wt 122.0 lb

## 2016-06-17 DIAGNOSIS — R310 Gross hematuria: Secondary | ICD-10-CM | POA: Diagnosis not present

## 2016-06-17 LAB — URINALYSIS, COMPLETE
Bilirubin, UA: NEGATIVE
GLUCOSE, UA: NEGATIVE
Ketones, UA: NEGATIVE
NITRITE UA: NEGATIVE
PH UA: 5.5 (ref 5.0–7.5)
Protein, UA: NEGATIVE
Specific Gravity, UA: 1.02 (ref 1.005–1.030)
UUROB: 0.2 mg/dL (ref 0.2–1.0)

## 2016-06-17 LAB — MICROSCOPIC EXAMINATION: Epithelial Cells (non renal): >10 /hpf — AB (ref 0–10)

## 2016-06-17 MED ORDER — CIPROFLOXACIN HCL 500 MG PO TABS
500.0000 mg | ORAL_TABLET | Freq: Once | ORAL | Status: AC
  Administered 2016-06-17: 500 mg via ORAL

## 2016-06-17 MED ORDER — LIDOCAINE HCL 2 % EX GEL
1.0000 "application " | Freq: Once | CUTANEOUS | Status: AC
  Administered 2016-06-17: 1 via URETHRAL

## 2016-06-17 NOTE — Procedures (Signed)
    Cystoscopy Procedure Note  Patient identification was confirmed, informed consent was obtained, and patient was prepped using Betadine solution.  Lidocaine jelly was administered per urethral meatus.    Preoperative abx where received prior to procedure.    Procedure: - Flexible cystoscope introduced, without any difficulty.   - Thorough search of the bladder revealed:    Urethral meatus stenosis - required dilation in order to accommodate the cystoscope.    normal urothelium    no stones    no ulcers     no tumors    no urethral polyps    no trabeculation  - Ureteral orifices were normal in position and appearance.  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  Post-Procedure: - Patient tolerated the procedure well  Plan: RTC after CT scan.

## 2016-06-23 ENCOUNTER — Ambulatory Visit
Admission: RE | Admit: 2016-06-23 | Discharge: 2016-06-23 | Disposition: A | Discharge status: Home / Self Care | Payer: Medicare Other | Source: Ambulatory Visit | Attending: Urology | Admitting: Urology

## 2016-06-23 DIAGNOSIS — R310 Gross hematuria: Secondary | ICD-10-CM | POA: Insufficient documentation

## 2016-06-23 DIAGNOSIS — K760 Fatty (change of) liver, not elsewhere classified: Secondary | ICD-10-CM | POA: Diagnosis not present

## 2016-06-23 MED ORDER — IOPAMIDOL (ISOVUE-370) INJECTION 76%
100.0000 mL | Freq: Once | INTRAVENOUS | Status: AC | PRN
  Administered 2016-06-23: 100 mL via INTRAVENOUS

## 2016-06-26 ENCOUNTER — Encounter: Payer: Self-pay | Admitting: Urology

## 2016-06-26 ENCOUNTER — Ambulatory Visit: Payer: Medicare Other | Admitting: Urology

## 2016-07-07 ENCOUNTER — Telehealth: Payer: Self-pay

## 2016-07-07 NOTE — Telephone Encounter (Signed)
No answer

## 2016-07-07 NOTE — Telephone Encounter (Signed)
-----   Message from Harle BattiestShannon A McGowan, PA-C sent at 06/29/2016  6:06 PM EDT ----- Please notify the patient that her CT scan was negative. Also send a copy of this to her primary care physician. She will need an appointment in 1 year for a recheck.

## 2016-07-15 NOTE — Telephone Encounter (Signed)
No answer

## 2016-07-18 NOTE — Telephone Encounter (Signed)
No answer. Will send a letter.  

## 2017-07-20 ENCOUNTER — Encounter: Payer: Self-pay | Admitting: *Deleted

## 2017-07-20 ENCOUNTER — Emergency Department
Admission: EM | Admit: 2017-07-20 | Discharge: 2017-07-20 | Disposition: A | Discharge status: Home / Self Care | Payer: Medicare Other | Attending: Emergency Medicine | Admitting: Emergency Medicine

## 2017-07-20 ENCOUNTER — Other Ambulatory Visit: Payer: Self-pay

## 2017-07-20 ENCOUNTER — Emergency Department: Payer: Medicare Other

## 2017-07-20 DIAGNOSIS — Y939 Activity, unspecified: Secondary | ICD-10-CM | POA: Insufficient documentation

## 2017-07-20 DIAGNOSIS — Y92019 Unspecified place in single-family (private) house as the place of occurrence of the external cause: Secondary | ICD-10-CM | POA: Insufficient documentation

## 2017-07-20 DIAGNOSIS — S7001XA Contusion of right hip, initial encounter: Secondary | ICD-10-CM | POA: Insufficient documentation

## 2017-07-20 DIAGNOSIS — E119 Type 2 diabetes mellitus without complications: Secondary | ICD-10-CM | POA: Insufficient documentation

## 2017-07-20 DIAGNOSIS — W010XXA Fall on same level from slipping, tripping and stumbling without subsequent striking against object, initial encounter: Secondary | ICD-10-CM | POA: Diagnosis not present

## 2017-07-20 DIAGNOSIS — Z7984 Long term (current) use of oral hypoglycemic drugs: Secondary | ICD-10-CM | POA: Diagnosis not present

## 2017-07-20 DIAGNOSIS — Y999 Unspecified external cause status: Secondary | ICD-10-CM | POA: Diagnosis not present

## 2017-07-20 DIAGNOSIS — Z7982 Long term (current) use of aspirin: Secondary | ICD-10-CM | POA: Diagnosis not present

## 2017-07-20 DIAGNOSIS — I10 Essential (primary) hypertension: Secondary | ICD-10-CM | POA: Diagnosis not present

## 2017-07-20 DIAGNOSIS — Z79899 Other long term (current) drug therapy: Secondary | ICD-10-CM | POA: Insufficient documentation

## 2017-07-20 DIAGNOSIS — W19XXXA Unspecified fall, initial encounter: Secondary | ICD-10-CM

## 2017-07-20 DIAGNOSIS — S79911A Unspecified injury of right hip, initial encounter: Secondary | ICD-10-CM | POA: Diagnosis present
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
## 2017-07-20 NOTE — ED Notes (Signed)
See triage note  states she tripped over her own feet  And fell  Having pain to right hip area  Ambulates with slight limp d/t pain

## 2017-07-20 NOTE — ED Triage Notes (Signed)
Per Interpreter, patient states she fell two days ago and c/o right buttock/hip pain. Patient states she tripped over her feet. Patient  Denies hitting her head.

## 2017-10-28 ENCOUNTER — Other Ambulatory Visit: Payer: Self-pay | Admitting: Student

## 2017-10-28 DIAGNOSIS — R945 Abnormal results of liver function studies: Principal | ICD-10-CM

## 2017-10-28 DIAGNOSIS — R7989 Other specified abnormal findings of blood chemistry: Secondary | ICD-10-CM

## 2017-11-02 ENCOUNTER — Other Ambulatory Visit: Payer: Self-pay | Admitting: Student

## 2017-11-02 DIAGNOSIS — R7989 Other specified abnormal findings of blood chemistry: Secondary | ICD-10-CM

## 2017-11-02 DIAGNOSIS — K760 Fatty (change of) liver, not elsewhere classified: Secondary | ICD-10-CM

## 2017-11-02 DIAGNOSIS — R945 Abnormal results of liver function studies: Principal | ICD-10-CM

## 2017-11-03 ENCOUNTER — Other Ambulatory Visit: Payer: Self-pay | Admitting: Student

## 2017-11-03 DIAGNOSIS — R7989 Other specified abnormal findings of blood chemistry: Secondary | ICD-10-CM

## 2017-11-03 DIAGNOSIS — R945 Abnormal results of liver function studies: Principal | ICD-10-CM

## 2017-11-03 DIAGNOSIS — K760 Fatty (change of) liver, not elsewhere classified: Secondary | ICD-10-CM

## 2017-11-16 ENCOUNTER — Ambulatory Visit
Admission: RE | Admit: 2017-11-16 | Discharge: 2017-11-16 | Disposition: A | Discharge status: Home / Self Care | Payer: Medicare Other | Source: Ambulatory Visit | Attending: Student | Admitting: Student

## 2017-11-16 ENCOUNTER — Ambulatory Visit: Payer: Medicare Other

## 2017-11-16 DIAGNOSIS — K760 Fatty (change of) liver, not elsewhere classified: Secondary | ICD-10-CM | POA: Diagnosis not present

## 2017-11-16 DIAGNOSIS — R7989 Other specified abnormal findings of blood chemistry: Secondary | ICD-10-CM | POA: Diagnosis present

## 2017-11-16 DIAGNOSIS — R945 Abnormal results of liver function studies: Secondary | ICD-10-CM

## 2018-04-13 ENCOUNTER — Other Ambulatory Visit: Payer: Self-pay | Admitting: Family Medicine

## 2018-04-13 DIAGNOSIS — Z1239 Encounter for other screening for malignant neoplasm of breast: Secondary | ICD-10-CM

## 2018-04-13 DIAGNOSIS — Z1231 Encounter for screening mammogram for malignant neoplasm of breast: Secondary | ICD-10-CM

## 2018-04-19 ENCOUNTER — Other Ambulatory Visit: Payer: Self-pay | Admitting: Internal Medicine

## 2018-04-19 ENCOUNTER — Other Ambulatory Visit: Payer: Self-pay | Admitting: Student

## 2018-04-19 DIAGNOSIS — R945 Abnormal results of liver function studies: Secondary | ICD-10-CM

## 2018-04-19 DIAGNOSIS — K7400 Hepatic fibrosis, unspecified: Secondary | ICD-10-CM

## 2018-04-19 DIAGNOSIS — R7989 Other specified abnormal findings of blood chemistry: Secondary | ICD-10-CM

## 2018-04-26 ENCOUNTER — Ambulatory Visit
Admission: RE | Admit: 2018-04-26 | Discharge: 2018-04-26 | Disposition: A | Discharge status: Home / Self Care | Payer: Medicare Other | Source: Ambulatory Visit | Attending: Internal Medicine | Admitting: Internal Medicine

## 2018-04-26 DIAGNOSIS — R945 Abnormal results of liver function studies: Secondary | ICD-10-CM | POA: Insufficient documentation

## 2018-04-26 DIAGNOSIS — K760 Fatty (change of) liver, not elsewhere classified: Secondary | ICD-10-CM | POA: Diagnosis not present

## 2018-04-26 DIAGNOSIS — K7400 Hepatic fibrosis, unspecified: Secondary | ICD-10-CM

## 2018-04-26 DIAGNOSIS — R7989 Other specified abnormal findings of blood chemistry: Secondary | ICD-10-CM

## 2018-04-26 MED ORDER — GADOBENATE DIMEGLUMINE 529 MG/ML IV SOLN
10.0000 mL | Freq: Once | INTRAVENOUS | Status: AC | PRN
  Administered 2018-04-26: 10 mL via INTRAVENOUS

## 2018-06-29 ENCOUNTER — Other Ambulatory Visit: Payer: Self-pay | Admitting: Family Medicine

## 2018-08-02 ENCOUNTER — Ambulatory Visit
Admission: RE | Admit: 2018-08-02 | Discharge: 2018-08-02 | Disposition: A | Discharge status: Home / Self Care | Payer: Medicare Other | Source: Ambulatory Visit | Attending: Family Medicine | Admitting: Family Medicine

## 2018-08-02 DIAGNOSIS — Z1231 Encounter for screening mammogram for malignant neoplasm of breast: Secondary | ICD-10-CM | POA: Insufficient documentation

## 2018-10-18 DIAGNOSIS — K7460 Unspecified cirrhosis of liver: Secondary | ICD-10-CM

## 2018-10-18 HISTORY — DX: Unspecified cirrhosis of liver: K74.60

## 2018-10-19 ENCOUNTER — Other Ambulatory Visit: Payer: Self-pay | Admitting: Family Medicine

## 2018-10-19 DIAGNOSIS — Z13820 Encounter for screening for osteoporosis: Secondary | ICD-10-CM

## 2018-11-01 ENCOUNTER — Inpatient Hospital Stay: Admit: 2018-11-01 | Payer: Self-pay

## 2018-11-01 ENCOUNTER — Ambulatory Visit
Admission: RE | Admit: 2018-11-01 | Discharge: 2018-11-01 | Disposition: A | Discharge status: Home / Self Care | Payer: Medicare Other | Source: Ambulatory Visit | Attending: Family Medicine | Admitting: Family Medicine

## 2018-11-01 ENCOUNTER — Other Ambulatory Visit: Admission: RE | Admit: 2018-11-01 | Payer: Self-pay | Source: Ambulatory Visit | Admitting: *Deleted

## 2018-11-01 ENCOUNTER — Other Ambulatory Visit: Payer: Self-pay | Admitting: Family Medicine

## 2018-11-01 DIAGNOSIS — M542 Cervicalgia: Secondary | ICD-10-CM

## 2018-11-01 DIAGNOSIS — M19011 Primary osteoarthritis, right shoulder: Secondary | ICD-10-CM | POA: Insufficient documentation

## 2018-11-01 DIAGNOSIS — M1990 Unspecified osteoarthritis, unspecified site: Secondary | ICD-10-CM | POA: Diagnosis present

## 2018-12-06 ENCOUNTER — Ambulatory Visit
Admission: RE | Admit: 2018-12-06 | Discharge: 2018-12-06 | Disposition: A | Discharge status: Home / Self Care | Payer: Medicare Other | Source: Ambulatory Visit | Attending: Family Medicine | Admitting: Family Medicine

## 2018-12-06 DIAGNOSIS — Z13820 Encounter for screening for osteoporosis: Secondary | ICD-10-CM | POA: Diagnosis present

## 2018-12-06 DIAGNOSIS — M810 Age-related osteoporosis without current pathological fracture: Secondary | ICD-10-CM | POA: Insufficient documentation

## 2019-02-08 ENCOUNTER — Other Ambulatory Visit: Payer: Self-pay | Admitting: Family Medicine

## 2019-02-08 DIAGNOSIS — G8929 Other chronic pain: Secondary | ICD-10-CM

## 2019-02-08 DIAGNOSIS — M542 Cervicalgia: Principal | ICD-10-CM

## 2019-02-16 ENCOUNTER — Ambulatory Visit: Admission: RE | Admit: 2019-02-16 | Payer: Medicare Other | Source: Ambulatory Visit

## 2019-09-28 ENCOUNTER — Ambulatory Visit
Admission: RE | Admit: 2019-09-28 | Discharge: 2019-09-28 | Disposition: A | Discharge status: Home / Self Care | Payer: Medicare Other | Source: Ambulatory Visit | Attending: Family Medicine | Admitting: Family Medicine

## 2019-09-28 ENCOUNTER — Other Ambulatory Visit: Payer: Self-pay | Admitting: Family Medicine

## 2019-09-28 DIAGNOSIS — R159 Full incontinence of feces: Secondary | ICD-10-CM | POA: Diagnosis not present

## 2019-11-06 ENCOUNTER — Encounter: Payer: Self-pay | Admitting: Emergency Medicine

## 2019-11-06 ENCOUNTER — Emergency Department: Payer: Medicare Other

## 2019-11-06 ENCOUNTER — Other Ambulatory Visit: Payer: Self-pay

## 2019-11-06 ENCOUNTER — Emergency Department
Admission: EM | Admit: 2019-11-06 | Discharge: 2019-11-06 | Disposition: A | Discharge status: Home / Self Care | Payer: Medicare Other | Attending: Emergency Medicine | Admitting: Emergency Medicine

## 2019-11-06 DIAGNOSIS — Z7982 Long term (current) use of aspirin: Secondary | ICD-10-CM | POA: Diagnosis not present

## 2019-11-06 DIAGNOSIS — Z79899 Other long term (current) drug therapy: Secondary | ICD-10-CM | POA: Diagnosis not present

## 2019-11-06 DIAGNOSIS — M25572 Pain in left ankle and joints of left foot: Secondary | ICD-10-CM

## 2019-11-06 DIAGNOSIS — Z794 Long term (current) use of insulin: Secondary | ICD-10-CM | POA: Insufficient documentation

## 2019-11-06 DIAGNOSIS — I10 Essential (primary) hypertension: Secondary | ICD-10-CM | POA: Insufficient documentation

## 2019-11-06 DIAGNOSIS — E119 Type 2 diabetes mellitus without complications: Secondary | ICD-10-CM | POA: Insufficient documentation

## 2019-11-06 LAB — URIC ACID: Uric Acid, Serum: 4.5 mg/dL (ref 2.5–7.1)

## 2019-11-06 MED ORDER — NAPROXEN 500 MG PO TABS: 500.0000 mg | ORAL_TABLET | Freq: Two times a day (BID) | ORAL | 0 refills | Status: AC

## 2019-11-06 MED ORDER — TRAMADOL HCL 50 MG PO TABS: 50.0000 mg | ORAL_TABLET | Freq: Two times a day (BID) | ORAL | 0 refills | Status: DC

## 2019-11-06 MED ORDER — TRAMADOL HCL 50 MG PO TABS
50.0000 mg | ORAL_TABLET | Freq: Once | ORAL | Status: AC
  Administered 2019-11-06: 50 mg via ORAL
  Filled 2019-11-06: qty 1

## 2019-11-06 MED ORDER — NAPROXEN 500 MG PO TABS
500.0000 mg | ORAL_TABLET | Freq: Once | ORAL | Status: AC
  Administered 2019-11-06: 500 mg via ORAL
  Filled 2019-11-06: qty 1

## 2019-11-06 MED ORDER — WALKER MISC: 0 refills | Status: DC

## 2019-11-06 NOTE — ED Notes (Signed)
Interpreter Services requested

## 2019-11-06 NOTE — Discharge Instructions (Addendum)
Your exam, XR, and Uric Acid level are essentially normal at this time. Your ankle pain may be due to tendinitis or joint pain. There is no evidence of gout or a bone fracture. Take the prescription meds as directed for pain and inflammation. You may apply ice to reduce pain and swelling. Follow-up with Dr. Ladoris Gene for ongoing ankle pain.   Su examen, XR y nivel de cido rico son esencialmente normales en este momento. Su dolor de tobillo puede deberse a una tendinitis o dolor en las articulaciones. No hay evidencia de gota o fractura de hueso. Tome los medicamentos recetados segn las indicaciones para Conservation officer, historic buildings y la inflamacin. Puede aplicar hielo para reducir Conservation officer, historic buildings y la hinchazn. Haga un seguimiento con el Dr. Ladoris Gene para el dolor de tobillo continuo.

## 2019-11-06 NOTE — ED Provider Notes (Signed)
[facility] Emergency Department Provider Note ____________________________________________  Time seen: 1750  I have reviewed the triage vital signs and the nursing notes.  HISTORY  Chief Complaint  Joint Swelling and Ankle Pain  History as told to Merri Ray PA-S. History limited by [place] language.  Interpreter Marchelle Folks B.), present for the interview and exam.  HPI Holly Schneider is a 73 y.o. female reports onset at home at about 1 pm. She denies any injury preceding the onset of pain. She was working around the house and noted slow onset of pain. The reports with pain with walking and weightbearing. She denies any previous history of ankle or foot pain. 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  She localizes pain to the posterior lateral aspect of the ankle.  She reports some local swelling to the lateral ankle as well.  Past Medical History:  Diagnosis Date  . Arthritis   . Diabetes (HCC)   . HLD (hyperlipidemia)   . HTN (hypertension)   . Urinary incontinence     Patient Active Problem List   Diagnosis Date Noted  . Vaginal atrophy 06/03/2016    Past Surgical History:  Procedure Laterality Date  . TUBAL LIGATION      Prior to Admission medications   Medication Sig Start Date End Date Taking? Authorizing Provider  aspirin EC 81 MG tablet Take 81 mg by mouth daily.     [provider]  atorvastatin (LIPITOR) 40 MG tablet  05/19/16   [provider]  conjugated estrogens (PREMARIN) vaginal cream Place 1 Applicatorful vaginally daily. Apply 0.5mg  (pea-sized amount)  just inside the vaginal introitus with a finger-tip every night for two weeks and then Monday, Wednesday and Friday nights. 05/29/16   Michiel Cowboy A, PA-C  estradiol (ESTRACE VAGINAL) 0.1 MG/GM vaginal cream Apply 0.5mg  (pea-sized amount)  just inside the vaginal introitus with a finger-tip every night for two weeks and then Monday, Wednesday and Friday nights.  05/29/16   McGowan, Carollee Herter A, PA-C  LANTUS SOLOSTAR 100 UNIT/ML Solostar Pen Inject 16 Units into the skin at bedtime.  11/12/15   [provider]  lisinopril (PRINIVIL,ZESTRIL) 20 MG tablet Take 20 mg by mouth daily.  10/11/14   [provider]  metFORMIN (GLUCOPHAGE) 500 MG tablet  05/15/16   [provider]  Misc. Devices Dan Humphreys) MISC Standard Walker for ambulation 11/06/19   Syler Norcia, Charlesetta Ivory, PA-C  naproxen (NAPROSYN) 500 MG tablet Take 1 tablet (500 mg total) by mouth 2 (two) times daily with a meal for 15 days. 11/06/19 11/21/19  Dorine Duffey, Charlesetta Ivory, PA-C  traMADol (ULTRAM) 50 MG tablet Take 1 tablet (50 mg total) by mouth 2 (two) times daily. 11/06/19   Kaysia Willard, Charlesetta Ivory, PA-C    Allergies Patient has no known allergies.  Family History  Problem Relation Age of Onset  . Kidney disease Neg Hx     Social History Social History   Tobacco Use  . Smoking status: Never Smoker  . Smokeless tobacco: Never Used  Substance Use Topics  . Alcohol use: No    Alcohol/week: 0.0 standard drinks  . Drug use: No    Review of Systems  Constitutional: Negative for fever. Cardiovascular: Negative for chest pain. Respiratory: Negative for shortness of breath. Musculoskeletal: Negative for back pain. Left ankle pain as above. Skin: Negative for rash. Neurological: Negative for headaches, focal weakness or numbness. ____________________________________________  PHYSICAL EXAM:  VITAL SIGNS: ED Triage Vitals  Enc Vitals Group     BP 11/06/19 1721 (!) 147/75     Pulse Rate 11/06/19 1721 74     Resp 11/06/19 1721 16     Temp 11/06/19 1721 98.7 F (37.1 C)     Temp Source 11/06/19 1721 Oral     SpO2 11/06/19 1721 99 %     Weight 11/06/19 1718 118 lb (53.5 kg)     Height 11/06/19 1718 5\' 1"  (1.549 m)     Head Circumference --      Peak Flow --      Pain Score 11/06/19 1718 10     Pain Loc --      Pain Edu? --      Excl. in GC? --      Constitutional: Alert and oriented. Well appearing and in no distress. Head: Normocephalic and atraumatic. Eyes: Conjunctivae are normal. Normal extraocular movements Cardiovascular: Normal rate, regular rhythm. Normal distal pulses. Respiratory: Normal respiratory effort. No wheezes/rales/rhonchi. Gastrointestinal: Soft and nontender. No distention. Musculoskeletal: Left ankle without any obvious deformity, dislocation, or joint effusion.  Patient with subtle soft swelling noted to the lateral ankle at the distal malleolus.  Patient is also exquisitely tender to palpation to the same region.  Manipulation of the calf and Achilles not elicit any pain, but palpation of the Achilles insertion did cause some discomfort.  Normal ankle range of motion is appreciated.  Negative anterior/posterior drawer.  Nontender with normal range of motion in all extremities.  Neurologic:  Normal gait without ataxia. Normal speech and language. No gross focal neurologic deficits are appreciated. Skin:  Skin is warm, dry and intact. No rash noted. ____________________________________________  LABORATORY   Labs Reviewed  URIC ACID  _____________________________________________   RADIOLOGY  DG Left Ankle IMPRESSION: No fracture or dislocation of the left ankle. Joint spaces are preserved. Mild soft tissue edema over the lateral ankle.  I, Lissa HoardJenise V Bacon-Yesha Muchow, personally viewed and evaluated these images (plain radiographs) as part of my medical decision making, as well as reviewing the written report by the radiologist. ____________________________________________  PROCEDURES  Ultram 50 mg PO Naproxen 500 mg PO Ace bandage Procedures ____________________________________________  INITIAL IMPRESSION / ASSESSMENT AND PLAN / ED COURSE  Geriatric patient with ED evaluation of acute ankle pain on the left.  Patient's clinical picture concerning for possible inflammatory etiology including gout or  osteoarthritis.  Patient's uric acid is normal at this time.  She does not show any acute degenerative or erosive process.  She does have some minor bone spurs appreciated.  Clinical findings are more consistent with a mild tendinitis or ankle sprain as well as generalized joint pain.  Patient will be discharged with a prescription for naproxen as well as Ultram to take as needed.  She is also advised to follow-up with primary provider for ongoing symptoms.  A prescription for a standard walker is also provided to help patient ambulate with weightbearing as tolerated.  She will follow with primary provider or return to the ED as needed.  Holly Schneider was evaluated in Emergency Department on 11/07/2019 for the symptoms described in the history of present illness. She was evaluated in the context of the global COVID-19 pandemic, which necessitated consideration that the patient might be at risk for infection with the SARS-CoV-2 virus that causes COVID-19. Institutional protocols and algorithms that pertain to the evaluation of patients at risk for COVID-19 are in a state of rapid change based on information released by regulatory bodies including the  CDC and federal and [employer]. These policies and algorithms were followed during the patient's care in the ED. ____________________________________________  FINAL CLINICAL IMPRESSION(S) / ED DIAGNOSES  Final diagnoses:  Acute left ankle pain      Trevaris Pennella, Dannielle Karvonen, PA-C 11/07/19 0026    Nance Pear, MD 11/14/19 1504

## 2019-11-06 NOTE — ED Notes (Signed)
Interpreter requested for blood draw

## 2019-11-06 NOTE — ED Triage Notes (Signed)
Pt to ED via POV c/o left ankle pain and swelling. Pt denies any known injury. Pt is in NAD.

## 2019-11-07 ENCOUNTER — Ambulatory Visit (INDEPENDENT_AMBULATORY_CARE_PROVIDER_SITE_OTHER): Payer: Medicare Other | Admitting: Gastroenterology

## 2019-11-07 ENCOUNTER — Other Ambulatory Visit: Payer: Self-pay

## 2019-11-07 ENCOUNTER — Encounter: Payer: Self-pay | Admitting: Gastroenterology

## 2019-11-07 VITALS — BP 123/67 | HR 52 | Temp 97.9°F | Ht 61.0 in | Wt 118.0 lb

## 2019-11-07 DIAGNOSIS — R194 Change in bowel habit: Secondary | ICD-10-CM | POA: Diagnosis not present

## 2019-11-07 DIAGNOSIS — E782 Mixed hyperlipidemia: Secondary | ICD-10-CM | POA: Insufficient documentation

## 2019-11-07 DIAGNOSIS — K7460 Unspecified cirrhosis of liver: Secondary | ICD-10-CM | POA: Diagnosis not present

## 2019-11-07 DIAGNOSIS — R159 Full incontinence of feces: Secondary | ICD-10-CM

## 2019-11-07 HISTORY — DX: Mixed hyperlipidemia: E78.2

## 2019-11-07 NOTE — H&P (View-Only) (Signed)
Gastroenterology Consultation  Referring Provider:     Preston Fleeting* Primary Care Physician:  Preston Fleeting, MD Primary Gastroenterologist:  Dr. Servando Snare     Reason for Consultation:     Fecal incontinence        HPI:   Holly Schneider is a 73 y.o. y/o female referred for consultation & management of fecal incontinence by Dr. Neita Goodnight, [name], MD.  This patient comes in today after being seen in the past by Dr. Mechele Collin and his nurse practitioner for abnormal liver enzymes.  The patient's work-up for abnormal liver enzymes showed a increased alkaline phosphatase and fractionation of that showed that the majority of the alkaline phosphatase was coming from the small intestines.  The patient's liver enzymes have been elevated at least since 2018 when I look at her previous labs.  The patient did not have any signs of autoimmune hepatitis or PBC.  The patient's labs did not appear to show any testing for the patient's immunity to hepatitis A or hepatitis B.  The patient did have an MRI that showed findings consistent with early cirrhosis. The patient denies constipation but states she was on MiraLAX by her primary care provider for her stool incontinence.  The patient states that her stools are usually soft and she has problems the more soft the stools are.  The patient did have a history of having 6 pregnancies that were delivered vaginally.  There is no report of any unexplained weight loss black stools or bloody stools.  She also denies any abdominal pain.  She does drink milk every day.  The patient's family member with her today states that it is part of her culture that she drinks milk every day.    Past Medical History:  Diagnosis Date  . Arthritis   . Diabetes (HCC)   . HLD (hyperlipidemia)   . HTN (hypertension)   . Urinary incontinence     Past Surgical History:  Procedure Laterality Date  . TUBAL LIGATION      Prior to Admission medications    Medication Sig Start Date End Date Taking? Authorizing Provider  aspirin EC 81 MG tablet Take 81 mg by mouth daily.     [provider]  atorvastatin (LIPITOR) 40 MG tablet  05/19/16   [provider]  conjugated estrogens (PREMARIN) vaginal cream Place 1 Applicatorful vaginally daily. Apply 0.5mg  (pea-sized amount)  just inside the vaginal introitus with a finger-tip every night for two weeks and then Monday, Wednesday and Friday nights. 05/29/16   Michiel Cowboy A, PA-C  estradiol (ESTRACE VAGINAL) 0.1 MG/GM vaginal cream Apply 0.5mg  (pea-sized amount)  just inside the vaginal introitus with a finger-tip every night for two weeks and then Monday, Wednesday and Friday nights. 05/29/16   McGowan, Carollee Herter A, PA-C  LANTUS SOLOSTAR 100 UNIT/ML Solostar Pen Inject 16 Units into the skin at bedtime.  11/12/15   [provider]  lisinopril (PRINIVIL,ZESTRIL) 20 MG tablet Take 20 mg by mouth daily.  10/11/14   [provider]  metFORMIN (GLUCOPHAGE) 500 MG tablet  05/15/16   [provider]  Misc. Devices Dan Humphreys) MISC Standard Walker for ambulation 11/06/19   Menshew, Charlesetta Ivory, PA-C  naproxen (NAPROSYN) 500 MG tablet Take 1 tablet (500 mg total) by mouth 2 (two) times daily with a meal for 15 days. 11/06/19 11/21/19  Menshew, Charlesetta Ivory, PA-C  traMADol (ULTRAM) 50 MG tablet Take 1 tablet (50 mg total) by mouth 2 (two)  times daily. 11/06/19   Menshew, Dannielle Karvonen, PA-C    Family History  Problem Relation Age of Onset  . Kidney disease Neg Hx      Social History   Tobacco Use  . Smoking status: Never Smoker  . Smokeless tobacco: Never Used  Substance Use Topics  . Alcohol use: No    Alcohol/week: 0.0 standard drinks  . Drug use: No    Allergies as of 11/07/2019  . (No Known Allergies)    Review of Systems:    All systems reviewed and negative except where noted in HPI.   Physical Exam:  There were no vitals taken for this visit. No LMP  recorded. Patient is postmenopausal. General:   Alert,  Well-developed, well-nourished, pleasant and cooperative in NAD Head:  Normocephalic and atraumatic. Eyes:  Sclera clear, no icterus.   Conjunctiva pink. Ears:  Normal auditory acuity. Nose:  No deformity, discharge, or lesions. Mouth:  No deformity or lesions,oropharynx pink & moist. Neck:  Supple; no masses or thyromegaly. Lungs:  Respirations even and unlabored.  Clear throughout to auscultation.   No wheezes, crackles, or rhonchi. No acute distress. Heart:  Regular rate and rhythm; no murmurs, clicks, rubs, or gallops. Abdomen:  Normal bowel sounds.  No bruits.  Soft, non-tender and non-distended without masses, hepatosplenomegaly or hernias noted.  No guarding or rebound tenderness.  Negative Carnett sign.   Rectal:  Deferred.  Msk:  Symmetrical without gross deformities.  Good, equal movement & strength bilaterally. Pulses:  Normal pulses noted. Extremities:  No clubbing or edema.  No cyanosis. Neurologic:  Alert and oriented x3;  grossly normal neurologically. Skin:  Intact without significant lesions or rashes.  No jaundice. Lymph Nodes:  No significant cervical adenopathy. Psych:  Alert and cooperative. Normal mood and affect.  Imaging Studies: Dg Ankle Complete Left  Result Date: 11/06/2019 CLINICAL DATA:  Pain and swelling, no known injury EXAM: LEFT ANKLE COMPLETE - 3+ VIEW COMPARISON:  None. FINDINGS: No fracture or dislocation of the left ankle. Joint spaces are preserved. Mild soft tissue edema over the lateral ankle. IMPRESSION: No fracture or dislocation of the left ankle. Joint spaces are preserved. Mild soft tissue edema over the lateral ankle. Electronically Signed   By: Eddie Candle M.D.   On: 11/06/2019 18:42    Assessment and Plan:   Holly Schneider is a 73 y.o. y/o female who comes in today with stool incontinence that has been going on for about 6 months.  The patient has been given MiraLAX by her primary  care provider which I have told the patient to stop.  The patient will start on Citrucel.  The patient will also be set up for blood work for alpha-fetoprotein since she has a history of cirrhosis.  She will also have an ultrasound of the right upper quadrant to rule out any hepatocellular carcinoma and she will have blood work sent off for her liver enzymes and to check for her immunity to hepatitis A and hepatitis B which I cannot seem to find in the chart.  Pending that we will determine if she needs vaccinations for these.  The patient will also be set up for colonoscopy to rule out any colon pathology as the cause of her symptoms.  The patient has been explained the plan and agrees with it.    Lucilla Lame, MD. Marval Regal    Note: This dictation was prepared with Dragon dictation along with smaller phrase technology. Any transcriptional errors that result  from this process are unintentional.

## 2019-11-07 NOTE — Progress Notes (Signed)
Gastroenterology Consultation  Referring Provider:     Preston Fleeting* Primary Care Physician:  Preston Fleeting, MD Primary Gastroenterologist:  Dr. Servando Snare     Reason for Consultation:     Fecal incontinence        HPI:   Holly Schneider is a 73 y.o. y/o female referred for consultation & management of fecal incontinence by Dr. Neita Goodnight, [name], MD.  This patient comes in today after being seen in the past by Dr. Mechele Collin and his nurse practitioner for abnormal liver enzymes.  The patient's work-up for abnormal liver enzymes showed a increased alkaline phosphatase and fractionation of that showed that the majority of the alkaline phosphatase was coming from the small intestines.  The patient's liver enzymes have been elevated at least since 2018 when I look at her previous labs.  The patient did not have any signs of autoimmune hepatitis or PBC.  The patient's labs did not appear to show any testing for the patient's immunity to hepatitis A or hepatitis B.  The patient did have an MRI that showed findings consistent with early cirrhosis. The patient denies constipation but states she was on MiraLAX by her primary care provider for her stool incontinence.  The patient states that her stools are usually soft and she has problems the more soft the stools are.  The patient did have a history of having 6 pregnancies that were delivered vaginally.  There is no report of any unexplained weight loss black stools or bloody stools.  She also denies any abdominal pain.  She does drink milk every day.  The patient's family member with her today states that it is part of her culture that she drinks milk every day.    Past Medical History:  Diagnosis Date  . Arthritis   . Diabetes (HCC)   . HLD (hyperlipidemia)   . HTN (hypertension)   . Urinary incontinence     Past Surgical History:  Procedure Laterality Date  . TUBAL LIGATION      Prior to Admission medications    Medication Sig Start Date End Date Taking? Authorizing Provider  aspirin EC 81 MG tablet Take 81 mg by mouth daily.     [provider]  atorvastatin (LIPITOR) 40 MG tablet  05/19/16   [provider]  conjugated estrogens (PREMARIN) vaginal cream Place 1 Applicatorful vaginally daily. Apply 0.5mg  (pea-sized amount)  just inside the vaginal introitus with a finger-tip every night for two weeks and then Monday, Wednesday and Friday nights. 05/29/16   Michiel Cowboy A, PA-C  estradiol (ESTRACE VAGINAL) 0.1 MG/GM vaginal cream Apply 0.5mg  (pea-sized amount)  just inside the vaginal introitus with a finger-tip every night for two weeks and then Monday, Wednesday and Friday nights. 05/29/16   McGowan, Carollee Herter A, PA-C  LANTUS SOLOSTAR 100 UNIT/ML Solostar Pen Inject 16 Units into the skin at bedtime.  11/12/15   [provider]  lisinopril (PRINIVIL,ZESTRIL) 20 MG tablet Take 20 mg by mouth daily.  10/11/14   [provider]  metFORMIN (GLUCOPHAGE) 500 MG tablet  05/15/16   [provider]  Misc. Devices Dan Humphreys) MISC Standard Walker for ambulation 11/06/19   Menshew, Charlesetta Ivory, PA-C  naproxen (NAPROSYN) 500 MG tablet Take 1 tablet (500 mg total) by mouth 2 (two) times daily with a meal for 15 days. 11/06/19 11/21/19  Menshew, Charlesetta Ivory, PA-C  traMADol (ULTRAM) 50 MG tablet Take 1 tablet (50 mg total) by mouth 2 (two)  times daily. 11/06/19   Menshew, Dannielle Karvonen, PA-C    Family History  Problem Relation Age of Onset  . Kidney disease Neg Hx      Social History   Tobacco Use  . Smoking status: Never Smoker  . Smokeless tobacco: Never Used  Substance Use Topics  . Alcohol use: No    Alcohol/week: 0.0 standard drinks  . Drug use: No    Allergies as of 11/07/2019  . (No Known Allergies)    Review of Systems:    All systems reviewed and negative except where noted in HPI.   Physical Exam:  There were no vitals taken for this visit. No LMP  recorded. Patient is postmenopausal. General:   Alert,  Well-developed, well-nourished, pleasant and cooperative in NAD Head:  Normocephalic and atraumatic. Eyes:  Sclera clear, no icterus.   Conjunctiva pink. Ears:  Normal auditory acuity. Nose:  No deformity, discharge, or lesions. Mouth:  No deformity or lesions,oropharynx pink & moist. Neck:  Supple; no masses or thyromegaly. Lungs:  Respirations even and unlabored.  Clear throughout to auscultation.   No wheezes, crackles, or rhonchi. No acute distress. Heart:  Regular rate and rhythm; no murmurs, clicks, rubs, or gallops. Abdomen:  Normal bowel sounds.  No bruits.  Soft, non-tender and non-distended without masses, hepatosplenomegaly or hernias noted.  No guarding or rebound tenderness.  Negative Carnett sign.   Rectal:  Deferred.  Msk:  Symmetrical without gross deformities.  Good, equal movement & strength bilaterally. Pulses:  Normal pulses noted. Extremities:  No clubbing or edema.  No cyanosis. Neurologic:  Alert and oriented x3;  grossly normal neurologically. Skin:  Intact without significant lesions or rashes.  No jaundice. Lymph Nodes:  No significant cervical adenopathy. Psych:  Alert and cooperative. Normal mood and affect.  Imaging Studies: Dg Ankle Complete Left  Result Date: 11/06/2019 CLINICAL DATA:  Pain and swelling, no known injury EXAM: LEFT ANKLE COMPLETE - 3+ VIEW COMPARISON:  None. FINDINGS: No fracture or dislocation of the left ankle. Joint spaces are preserved. Mild soft tissue edema over the lateral ankle. IMPRESSION: No fracture or dislocation of the left ankle. Joint spaces are preserved. Mild soft tissue edema over the lateral ankle. Electronically Signed   By: Eddie Candle M.D.   On: 11/06/2019 18:42    Assessment and Plan:   Holly Schneider is a 73 y.o. y/o female who comes in today with stool incontinence that has been going on for about 6 months.  The patient has been given MiraLAX by her primary  care provider which I have told the patient to stop.  The patient will start on Citrucel.  The patient will also be set up for blood work for alpha-fetoprotein since she has a history of cirrhosis.  She will also have an ultrasound of the right upper quadrant to rule out any hepatocellular carcinoma and she will have blood work sent off for her liver enzymes and to check for her immunity to hepatitis A and hepatitis B which I cannot seem to find in the chart.  Pending that we will determine if she needs vaccinations for these.  The patient will also be set up for colonoscopy to rule out any colon pathology as the cause of her symptoms.  The patient has been explained the plan and agrees with it.    Lucilla Lame, MD. Marval Regal    Note: This dictation was prepared with Dragon dictation along with smaller phrase technology. Any transcriptional errors that result  from this process are unintentional.

## 2019-11-08 LAB — HEPATIC FUNCTION PANEL
ALT: 73 IU/L — ABNORMAL HIGH (ref 0–32)
AST: 90 IU/L — ABNORMAL HIGH (ref 0–40)
Albumin: 4.3 g/dL (ref 3.7–4.7)
Alkaline Phosphatase: 137 IU/L — ABNORMAL HIGH (ref 39–117)
Bilirubin Total: 0.6 mg/dL (ref 0.0–1.2)
Bilirubin, Direct: 0.2 mg/dL (ref 0.00–0.40)
Total Protein: 7.8 g/dL (ref 6.0–8.5)

## 2019-11-08 LAB — HEPATITIS B SURFACE ANTIBODY,QUALITATIVE: Hep B Surface Ab, Qual: REACTIVE

## 2019-11-08 LAB — HEPATITIS B SURFACE ANTIGEN: Hepatitis B Surface Ag: NEGATIVE

## 2019-11-08 LAB — HEPATITIS A ANTIBODY, TOTAL: hep A Total Ab: POSITIVE — AB

## 2019-11-08 LAB — AFP TUMOR MARKER: AFP, Serum, Tumor Marker: 5.2 ng/mL (ref 0.0–8.3)

## 2019-11-14 ENCOUNTER — Ambulatory Visit: Payer: Medicare Other

## 2019-11-15 ENCOUNTER — Telehealth: Payer: Self-pay | Admitting: Gastroenterology

## 2019-11-15 ENCOUNTER — Other Ambulatory Visit: Payer: Self-pay

## 2019-11-15 MED ORDER — NA SULFATE-K SULFATE-MG SULF 17.5-3.13-1.6 GM/177ML PO SOLN: 1.0000 | Freq: Once | ORAL | 0 refills | Status: AC

## 2019-11-15 NOTE — Telephone Encounter (Signed)
Contacted Sharee Pimple and [facility].  Thanks [employer]

## 2019-11-15 NOTE — Telephone Encounter (Signed)
Nurse Sharee Pimple from [facility] left vm to see what pharmacy we send the prep kit to for pt to pick up please call [phone] ext [phone]

## 2019-11-21 ENCOUNTER — Other Ambulatory Visit: Payer: Self-pay

## 2019-11-21 ENCOUNTER — Encounter: Payer: Self-pay | Admitting: *Deleted

## 2019-11-23 NOTE — Discharge Instructions (Signed)
Anestesia general en adultos, cuidados posteriores  General Anesthesia, Adult, Care After  Lea esta informacin sobre cmo cuidarse despus del procedimiento. El mdico tambin podr darle instrucciones ms especficas. Comunquese con su mdico si tiene problemas o preguntas.  Qu puedo esperar despus del procedimiento?  Luego del procedimiento, son comunes los siguiente efectos secundarios:  Dolor o molestias en el lugar de la va intravenosa (i.v.).  Nuseas.  Vmitos.  Dolor de garganta.  Dificultad para concentrarse.  Sentir fro o tener escalofros.  Debilidad o cansancio.  Somnolencia y fatiga.  Malestar y dolor corporal. Estos efectos secundarios pueden afectar partes del cuerpo que no estuvieron involucradas en la ciruga.  Siga estas indicaciones en su casa:    Durante al menos 24 horas despus del procedimiento:  Pdale a un adulto responsable que permanezca con usted. Es importante que alguien cuide de usted hasta que se despierte y est alerta.  Descanse todo lo que sea necesario.  No haga lo siguiente:  Participar en actividades en las que podra caerse o lastimarse.  Conducir.  Operar maquinarias pesadas.  Beber alcohol.  Tomar somnferos o medicamentos que causen somnolencia.  Firmar documentos legales ni tomar decisiones importantes.  Cuidar a nios por su cuenta.  Qu debe comer y beber  Siga las indicaciones del mdico respecto de las restricciones de comidas o bebidas.  Cuando tenga hambre, comience a comer cantidades pequeas de alimentos que sean blandos y fciles de digerir (livianos), como una tostada. Retome su dieta habitual de forma gradual.  Beba suficiente lquido como para mantener la orina de color amarillo plido.  Si vomita, rehidrtese tomando agua, jugo o caldo transparente.  Instrucciones generales  Si tiene apnea del sueo, la ciruga y ciertos medicamentos pueden aumentar el riesgo de problemas respiratorios. Siga las indicaciones del mdico respecto al uso de su dispositivo  para dormir:  Siempre que duerma, incluso durante las siestas que tome en el da.  Mientras tome analgsicos recetados, medicamentos para dormir o medicamentos que producen somnolencia.  Reanude sus actividades normales segn lo indicado por el mdico. Pregntele al mdico qu actividades son seguras para usted.  Tome los medicamentos de venta libre y los recetados solamente como se lo haya indicado el mdico.  Si fuma, no lo haga sin supervisin.  Concurra a todas las visitas de seguimiento como se lo haya indicado el mdico. Esto es importante.  Comunquese con un mdico si:  Tiene nuseas o vmitos que no mejoran con medicamentos.  No puede comer ni beber sin vomitar.  El dolor no se alivia con medicamentos.  No puede orinar.  Tiene una erupcin cutnea.  Tiene fiebre.  Presenta enrojecimiento alrededor del lugar de la va intravenosa (i.v.) que empeora.  Solicite ayuda de inmediato si:  Tiene dificultad para respirar.  Siente dolor en el pecho.  Observa sangre en la orina o heces, o vomita sangre.  Resumen  Despus del procedimiento, es comn tener dolor de garganta y nuseas. Tambin es comn sentirse cansado.  Pdale a un adulto responsable que permanezca con usted durante 24 horas despus de la anestesia general. Es importante que alguien cuide de usted hasta que se despierte y est alerta.  Cuando tenga hambre, comience a comer cantidades pequeas de alimentos que sean blandos y fciles de digerir (livianos), como una tostada. Retome su dieta habitual de forma gradual.  Beba suficiente lquido como para mantener la orina de color amarillo plido.  Reanude sus actividades normales segn lo indicado por el mdico. Pregntele al mdico   el consejo del mdico. Asegrese de hacerle al mdico cualquier pregunta que tenga. Document  Released: 12/15/2005 Document Revised: 10/12/2017 Document Reviewed: 10/12/2017 Elsevier Patient Education  2020 [employer].

## 2019-11-25 ENCOUNTER — Other Ambulatory Visit
Admission: RE | Admit: 2019-11-25 | Discharge: 2019-11-25 | Disposition: A | Discharge status: Home / Self Care | Payer: Medicare Other | Source: Ambulatory Visit | Attending: Gastroenterology | Admitting: Gastroenterology

## 2019-11-25 ENCOUNTER — Other Ambulatory Visit: Payer: Self-pay

## 2019-11-25 DIAGNOSIS — Z01812 Encounter for preprocedural laboratory examination: Secondary | ICD-10-CM | POA: Diagnosis present

## 2019-11-25 DIAGNOSIS — Z20828 Contact with and (suspected) exposure to other viral communicable diseases: Secondary | ICD-10-CM | POA: Diagnosis not present

## 2019-11-26 LAB — SARS CORONAVIRUS 2 (TAT 6-24 HRS): SARS Coronavirus 2: NEGATIVE

## 2019-11-28 ENCOUNTER — Other Ambulatory Visit: Payer: Self-pay

## 2019-11-28 ENCOUNTER — Ambulatory Visit: Payer: Medicare Other | Admitting: Anesthesiology

## 2019-11-28 ENCOUNTER — Encounter
Admission: RE | Disposition: A | Discharge status: Home / Self Care | Payer: Self-pay | Source: Home / Self Care | Attending: Gastroenterology

## 2019-11-28 ENCOUNTER — Ambulatory Visit
Admission: RE | Admit: 2019-11-28 | Discharge: 2019-11-28 | Disposition: A | Discharge status: Home / Self Care | Payer: Medicare Other | Attending: Gastroenterology | Admitting: Gastroenterology

## 2019-11-28 DIAGNOSIS — Z79899 Other long term (current) drug therapy: Secondary | ICD-10-CM | POA: Insufficient documentation

## 2019-11-28 DIAGNOSIS — K5730 Diverticulosis of large intestine without perforation or abscess without bleeding: Secondary | ICD-10-CM | POA: Diagnosis not present

## 2019-11-28 DIAGNOSIS — Z7982 Long term (current) use of aspirin: Secondary | ICD-10-CM | POA: Diagnosis not present

## 2019-11-28 DIAGNOSIS — I10 Essential (primary) hypertension: Secondary | ICD-10-CM | POA: Insufficient documentation

## 2019-11-28 DIAGNOSIS — R159 Full incontinence of feces: Secondary | ICD-10-CM | POA: Insufficient documentation

## 2019-11-28 DIAGNOSIS — E119 Type 2 diabetes mellitus without complications: Secondary | ICD-10-CM | POA: Diagnosis not present

## 2019-11-28 DIAGNOSIS — R150 Incomplete defecation: Secondary | ICD-10-CM | POA: Diagnosis not present

## 2019-11-28 DIAGNOSIS — M1990 Unspecified osteoarthritis, unspecified site: Secondary | ICD-10-CM | POA: Diagnosis not present

## 2019-11-28 DIAGNOSIS — E785 Hyperlipidemia, unspecified: Secondary | ICD-10-CM | POA: Insufficient documentation

## 2019-11-28 DIAGNOSIS — Z791 Long term (current) use of non-steroidal anti-inflammatories (NSAID): Secondary | ICD-10-CM | POA: Diagnosis not present

## 2019-11-28 DIAGNOSIS — Z794 Long term (current) use of insulin: Secondary | ICD-10-CM | POA: Insufficient documentation

## 2019-11-28 HISTORY — DX: Presence of dental prosthetic device (complete) (partial): Z97.2

## 2019-11-28 HISTORY — PX: COLONOSCOPY WITH PROPOFOL: SHX5780

## 2019-11-28 LAB — GLUCOSE, CAPILLARY
Glucose-Capillary: 109 mg/dL — ABNORMAL HIGH (ref 70–99)
Glucose-Capillary: 99 mg/dL (ref 70–99)

## 2019-11-28 SURGERY — COLONOSCOPY WITH PROPOFOL
Anesthesia: General | Site: Rectum

## 2019-11-28 MED ORDER — LIDOCAINE HCL (CARDIAC) PF 100 MG/5ML IV SOSY
PREFILLED_SYRINGE | INTRAVENOUS | Status: DC | PRN
  Administered 2019-11-28: 30 mg via INTRAVENOUS

## 2019-11-28 MED ORDER — ACETAMINOPHEN 325 MG PO TABS: 325.0000 mg | ORAL_TABLET | Freq: Once | ORAL | Status: DC

## 2019-11-28 MED ORDER — STERILE WATER FOR IRRIGATION IR SOLN
Status: DC | PRN
  Administered 2019-11-28: 50 mL

## 2019-11-28 MED ORDER — ACETAMINOPHEN 160 MG/5ML PO SOLN: 325.0000 mg | Freq: Once | ORAL | Status: DC

## 2019-11-28 MED ORDER — GLYCOPYRROLATE 0.2 MG/ML IJ SOLN
INTRAMUSCULAR | Status: DC | PRN
  Administered 2019-11-28: 0.1 mg via INTRAVENOUS

## 2019-11-28 MED ORDER — LACTATED RINGERS IV SOLN
INTRAVENOUS | Status: DC
  Administered 2019-11-28: 08:00:00 via INTRAVENOUS

## 2019-11-28 MED ORDER — PROPOFOL 10 MG/ML IV BOLUS
INTRAVENOUS | Status: DC | PRN
  Administered 2019-11-28: 100 mg via INTRAVENOUS
  Administered 2019-11-28: 20 mg via INTRAVENOUS
  Administered 2019-11-28: 40 mg via INTRAVENOUS

## 2019-11-28 SURGICAL SUPPLY — 5 items
CANISTER SUCT 1200ML W/VALVE (MISCELLANEOUS) ×3 IMPLANT
GOWN CVR UNV OPN BCK APRN NK (MISCELLANEOUS) ×2 IMPLANT
GOWN ISOL THUMB LOOP REG UNIV (MISCELLANEOUS) ×4
KIT ENDO PROCEDURE OLY (KITS) ×3 IMPLANT
WATER STERILE IRR 250ML POUR (IV SOLUTION) ×3 IMPLANT

## 2019-11-28 NOTE — Anesthesia Procedure Notes (Signed)
Date/Time: 11/28/2019 7:32 AM Performed by: Cameron Ali, CRNA Pre-anesthesia Checklist: Patient identified, Emergency Drugs available, Suction available, Timeout performed and Patient being monitored Patient Re-evaluated:Patient Re-evaluated prior to induction Oxygen Delivery Method: Nasal cannula Placement Confirmation: positive ETCO2

## 2019-11-28 NOTE — Anesthesia Preprocedure Evaluation (Signed)
Anesthesia Evaluation  Patient identified by MRN, date of birth, ID band Patient awake    Reviewed: Allergy & Precautions, H&P , NPO status , Patient's Chart, lab work & pertinent test results  Airway Mallampati: II  TM Distance: >3 FB Neck ROM: full    Dental no notable dental hx.    Pulmonary    Pulmonary exam normal breath sounds clear to auscultation       Cardiovascular hypertension, Normal cardiovascular exam Rhythm:regular Rate:Normal     Neuro/Psych    GI/Hepatic   Endo/Other  diabetes  Renal/GU      Musculoskeletal   Abdominal   Peds  Hematology   Anesthesia Other Findings   Reproductive/Obstetrics                             Anesthesia Physical Anesthesia Plan  ASA: II  Anesthesia Plan: General   Post-op Pain Management:    Induction: Intravenous  PONV Risk Score and Plan: 3 and Propofol infusion, Treatment may vary due to age or medical condition and TIVA  Airway Management Planned: Natural Airway  Additional Equipment:   Intra-op Plan:   Post-operative Plan:   Informed Consent: I have reviewed the patients History and Physical, chart, labs and discussed the procedure including the risks, benefits and alternatives for the proposed anesthesia with the patient or authorized representative who has indicated his/her understanding and acceptance.       Plan Discussed with: CRNA  Anesthesia Plan Comments:         Anesthesia Quick Evaluation

## 2019-11-28 NOTE — Op Note (Signed)
[facility]astroenter[facility] Patient Name: Holly Schneider Procedure Date: 11/28/2019 7:26 AM MRN: [id number] Account #: [id number] Date of Birth: 05/02/1946 Admit Type: Outpatient Age: 73 [facility] Gender: Female Note Status: Finalized Procedure:             Colonoscopy Indications:           Fecal incontinence Providers:             Midge Miniumarren Kiylee Thoreson MD, MD Referring MD:          Presley RaddleAdrian Mancheno Revelo (Referring MD) Medicines:             Propofol per Anesthesia Complications:         No immediate complications. Procedure:             Pre-Anesthesia Assessment:                        - Prior to the procedure, a History and Physical was                         performed, and patient medications and allergies were                         reviewed. The patient's tolerance of previous                         anesthesia was also reviewed. The risks and benefits                         of the procedure and the sedation options and risks                         were discussed with the patient. All questions were                         answered, and informed consent was obtained. Prior                         Anticoagulants: The patient has taken no previous                         anticoagulant or antiplatelet agents. ASA Grade                         Assessment: II - A patient with mild systemic disease.                         After reviewing the risks and benefits, the patient                         was deemed in satisfactory condition to undergo the                         procedure.                        After obtaining informed consent, the colonoscope was  passed under direct vision. Throughout the procedure,                         the patient's blood pressure, pulse, and oxygen                         saturations were monitored continuously. The                         Colonoscope was introduced through the anus and                 advanced to the the cecum, identified by appendiceal                         orifice and ileocecal valve. The colonoscopy was                         performed without difficulty. The patient tolerated                         the procedure well. The quality of the bowel                         preparation was good. Findings:      The perianal and digital rectal examinations were normal.      Multiple small-mouthed diverticula were found in the sigmoid colon and       descending colon. Impression:            - Diverticulosis in the sigmoid colon and in the                         descending colon.                        - No specimens collected. Recommendation:        - Discharge patient to home.                        - Resume previous diet.                        - Continue present medications. Procedure Code(s):     --- Professional ---                        [phone], Colonoscopy, flexible; diagnostic, including                         collection of specimen(s) by brushing or washing, when                         performed (separate procedure) Diagnosis Code(s):     --- Professional ---                        R15.9, Full incontinence of feces CPT copyright 2019 American Medical Association. All rights reserved. The codes documented in this report are preliminary and upon coder review may  be revised to meet current compliance requirements. Lucilla Lame MD, MD 11/28/2019 7:58:55 AM This report has been signed electronically. Number of Addenda: 0  Note Initiated On: 11/28/2019 7:26 AM Scope Withdrawal Time: 0 hours 8 minutes 55 seconds  Total Procedure Duration: 0 hours 19 minutes 0 seconds  Estimated Blood Loss:  Estimated blood loss: none.      [facility]

## 2019-11-28 NOTE — Interval H&P Note (Signed)
History and Physical Interval Note:  11/28/2019 7:22 AM  Holly Schneider  has presented today for surgery, with the diagnosis of Fecal incontinence R15.  The various methods of treatment have been discussed with the patient and family. After consideration of risks, benefits and other options for treatment, the patient has consented to  Procedure(s) with comments: COLONOSCOPY WITH PROPOFOL (N/A) - Dioabetes - insulin as a surgical intervention.  The patient's history has been reviewed, patient examined, no change in status, stable for surgery.  I have reviewed the patient's chart and labs.  Questions were answered to the patient's satisfaction.     Asa Baudoin [employer]

## 2019-11-28 NOTE — Transfer of Care (Signed)
Immediate Anesthesia Transfer of Care Note  Patient: Holly Schneider  Procedure(s) Performed: COLONOSCOPY WITH PROPOFOL (N/A Rectum)  Patient Location: PACU  Anesthesia Type: General  Level of Consciousness: awake, alert  and patient cooperative  Airway and Oxygen Therapy: Patient Spontanous Breathing and Patient connected to supplemental oxygen  Post-op Assessment: Post-op Vital signs reviewed, Patient's Cardiovascular Status Stable, Respiratory Function Stable, Patent Airway and No signs of Nausea or vomiting  Post-op Vital Signs: Reviewed and stable  Complications: No apparent anesthesia complications

## 2019-11-28 NOTE — Anesthesia Postprocedure Evaluation (Signed)
Anesthesia Post Note  Patient: Holly Schneider  Procedure(s) Performed: COLONOSCOPY WITH PROPOFOL (N/A Rectum)     Patient location during evaluation: PACU Anesthesia Type: General Level of consciousness: awake and alert and oriented Pain management: satisfactory to patient Vital Signs Assessment: post-procedure vital signs reviewed and stable Respiratory status: spontaneous breathing, nonlabored ventilation and respiratory function stable Cardiovascular status: blood pressure returned to baseline and stable Postop Assessment: Adequate PO intake and No signs of nausea or vomiting Anesthetic complications: no    Raliegh Ip

## 2019-12-16 ENCOUNTER — Telehealth: Payer: Self-pay

## 2019-12-16 NOTE — Telephone Encounter (Signed)
-----   Message from Lucilla Lame, MD sent at 11/14/2019  5:18 PM EST ----- Let the patient know that she does not need a vaccination for either hepatitis A or hepatitis B since it appears that she is immune.  Her liver enzymes continue to be elevated.

## 2019-12-16 NOTE — Telephone Encounter (Signed)
LVM for pt to return my call.

## 2020-03-10 ENCOUNTER — Emergency Department: Payer: Medicare Other

## 2020-03-10 ENCOUNTER — Other Ambulatory Visit: Payer: Self-pay

## 2020-03-10 DIAGNOSIS — Y9389 Activity, other specified: Secondary | ICD-10-CM | POA: Diagnosis not present

## 2020-03-10 DIAGNOSIS — Y929 Unspecified place or not applicable: Secondary | ICD-10-CM | POA: Diagnosis not present

## 2020-03-10 DIAGNOSIS — S0101XA Laceration without foreign body of scalp, initial encounter: Secondary | ICD-10-CM | POA: Insufficient documentation

## 2020-03-10 DIAGNOSIS — Z794 Long term (current) use of insulin: Secondary | ICD-10-CM | POA: Insufficient documentation

## 2020-03-10 DIAGNOSIS — E119 Type 2 diabetes mellitus without complications: Secondary | ICD-10-CM | POA: Insufficient documentation

## 2020-03-10 DIAGNOSIS — Y999 Unspecified external cause status: Secondary | ICD-10-CM | POA: Insufficient documentation

## 2020-03-10 DIAGNOSIS — W268XXA Contact with other sharp object(s), not elsewhere classified, initial encounter: Secondary | ICD-10-CM | POA: Insufficient documentation

## 2020-03-10 NOTE — ED Triage Notes (Signed)
Pt states she was at home and dropped a piece of metal onto the top of her head, states the metal had a clip and the clip hit her head. Pt has a small laceration on the top part of her head, bleeding is controlled and pt is tender to palpation at site. Pt denies loss of consciousness, fall or dizziness following incident. States metal is not heavy but unable to give a weight.

## 2020-03-11 ENCOUNTER — Emergency Department
Admission: EM | Admit: 2020-03-11 | Discharge: 2020-03-11 | Disposition: A | Discharge status: Home / Self Care | Payer: Medicare Other | Attending: Emergency Medicine | Admitting: Emergency Medicine

## 2020-03-11 DIAGNOSIS — S0101XA Laceration without foreign body of scalp, initial encounter: Secondary | ICD-10-CM

## 2020-03-11 NOTE — ED Provider Notes (Signed)
[facility] Emergency Department Provider Note   ____________________________________________   First MD Initiated Contact with Patient 03/11/20 0159     (approximate)  I have reviewed the triage vital signs and the nursing notes.   HISTORY  Chief Complaint Head Laceration    HPI Holly Schneider is a 74 y.o. female with past medical history of hypertension and diabetes who presents to the ED complaining of head injury.  History is limited as patient is Spanish-speaking only and history obtained via interpreter [phone].  Patient states that around 5 PM yesterday afternoon she was going to get in a car when her head hit a small metal hook on the upper part of the doorway.  She states that it stuck directly into her head and she noticed some bleeding, but she did not lose consciousness.  She now complains of pain around the area of the injury, but denies any numbness or weakness.  She is not anticoagulated and states her last tetanus was about 5 years ago.        Past Medical History:  Diagnosis Date  . Arthritis   . Diabetes (HCC)   . HLD (hyperlipidemia)   . HTN (hypertension)   . Urinary incontinence   . Wears dentures    partial upper and lower    Patient Active Problem List   Diagnosis Date Noted  . Incomplete defecation   . Mixed hyperlipidemia 11/07/2019  . Cirrhosis of liver without ascites (HCC) 10/18/2018  . Vaginal atrophy 06/03/2016  . Benign essential hypertension 12/07/2015  . Moderate mitral insufficiency 02/28/2015    Past Surgical History:  Procedure Laterality Date  . COLONOSCOPY WITH PROPOFOL N/A 11/28/2019   Procedure: COLONOSCOPY WITH PROPOFOL;  Surgeon: Midge Minium, MD;  Location: [facility];  Service: Endoscopy;  Laterality: N/A;  Dioabetes - insulin  . TUBAL LIGATION      Prior to Admission medications   Medication Sig Start Date End Date Taking? Authorizing Provider  aspirin EC 81 MG tablet Take 81 mg by  mouth daily.     [provider]  atorvastatin (LIPITOR) 40 MG tablet  05/19/16   [provider]  ibuprofen (ADVIL) 400 MG tablet  08/01/19   [provider]  LANTUS SOLOSTAR 100 UNIT/ML Solostar Pen Inject 16 Units into the skin at bedtime.  11/12/15   [provider]  LEVEMIR FLEXTOUCH 100 UNIT/ML Pen  09/06/19   [provider]  lisinopril (PRINIVIL,ZESTRIL) 20 MG tablet Take 20 mg by mouth daily.  10/11/14   [provider]  Misc. Devices Dan Humphreys) MISC Standard Walker for ambulation 11/06/19   Menshew, Charlesetta Ivory, PA-C  Multiple Vitamins-Minerals (PRESERVISION AREDS 2 PO) Take by mouth daily.    [provider]  psyllium (HYDROCIL/METAMUCIL) 95 % PACK Take 1 packet by mouth daily.    [provider]  traMADol (ULTRAM) 50 MG tablet Take 1 tablet (50 mg total) by mouth 2 (two) times daily. 11/06/19   Menshew, Charlesetta Ivory, PA-C    Allergies Glucophage [metformin] and Mobic [meloxicam]  Family History  Problem Relation Age of Onset  . Kidney disease Neg Hx     Social History Social History   Tobacco Use  . Smoking status: Never Smoker  . Smokeless tobacco: Never Used  Substance Use Topics  . Alcohol use: No    Alcohol/week: 0.0 standard drinks  . Drug use: No    Review of Systems  Constitutional: No fever/chills Eyes: No visual changes.  ENT: No sore throat. Cardiovascular: Denies chest pain. Respiratory: Denies shortness of breath. Gastrointestinal: No abdominal pain.  No nausea, no vomiting.  No diarrhea.  No constipation. Genitourinary: Negative for dysuria. Musculoskeletal: Negative for back pain. Skin: Negative for rash.  Positive for laceration. Neurological: Negative for headaches, focal weakness or numbness.  ____________________________________________   PHYSICAL EXAM:  VITAL SIGNS: ED Triage Vitals  Enc Vitals Group     BP 03/10/20 2223 (!) 113/99     Pulse Rate 03/10/20 2223 65      Resp 03/10/20 2223 16     Temp 03/10/20 2223 98.4 F (36.9 C)     Temp Source 03/10/20 2223 Oral     SpO2 03/10/20 2223 99 %     Weight 03/10/20 2230 118 lb (53.5 kg)     Height 03/10/20 2230 5' (1.524 m)     Head Circumference --      Peak Flow --      Pain Score 03/10/20 2229 5     Pain Loc --      Pain Edu? --      Excl. in [place]? --     Constitutional: Alert and oriented. Eyes: Conjunctivae are normal. Head: Small superficial laceration to the top of scalp with no active bleeding. Nose: No congestion/rhinnorhea. Mouth/Throat: Mucous membranes are moist. Neck: Normal ROM Cardiovascular: Normal rate, regular rhythm. Grossly normal heart sounds. Respiratory: Normal respiratory effort.  No retractions. Lungs CTAB. Gastrointestinal: Soft and nontender. No distention. Genitourinary: deferred Musculoskeletal: No lower extremity tenderness nor edema. Neurologic:  Normal speech and language. No gross focal neurologic deficits are appreciated. Skin:  Skin is warm, dry and intact. No rash noted. Psychiatric: Mood and affect are normal. Speech and behavior are normal.  ____________________________________________   LABS (all labs ordered are listed, but only abnormal results are displayed)  Labs Reviewed - No data to display   PROCEDURES  Procedure(s) performed (including Critical Care):  Procedures   ____________________________________________   INITIAL IMPRESSION / ASSESSMENT AND PLAN / ED COURSE       74 year old female presents to the ED after having a small piece of metal hit the top of her head, causing a small superficial laceration.  CT imaging of head and C-spine are negative for acute process.  Laceration is very superficial and does not require repair.  She states that her tetanus is up-to-date.  Wound was cleaned and antibiotics are not indicated at this time.  She is appropriate for discharge home and counseled to return to the ED for new or worsening symptoms.   Patient agrees with plan.      ____________________________________________   FINAL CLINICAL IMPRESSION(S) / ED DIAGNOSES  Final diagnoses:  Laceration of scalp, initial encounter     ED Discharge Orders    None       Note:  This document was prepared using Dragon voice recognition software and may include unintentional dictation errors.   Blake Divine, MD 03/11/20 [phone]

## 2020-10-26 ENCOUNTER — Other Ambulatory Visit: Payer: Self-pay | Admitting: Family Medicine

## 2020-10-26 DIAGNOSIS — Z1231 Encounter for screening mammogram for malignant neoplasm of breast: Secondary | ICD-10-CM

## 2020-12-03 ENCOUNTER — Ambulatory Visit
Admission: RE | Admit: 2020-12-03 | Discharge: 2020-12-03 | Disposition: A | Discharge status: Home / Self Care | Payer: Medicare Other | Source: Ambulatory Visit | Attending: Family Medicine | Admitting: Family Medicine

## 2020-12-03 ENCOUNTER — Other Ambulatory Visit: Payer: Self-pay

## 2020-12-03 DIAGNOSIS — Z1231 Encounter for screening mammogram for malignant neoplasm of breast: Secondary | ICD-10-CM | POA: Insufficient documentation

## 2021-10-16 ENCOUNTER — Emergency Department
Admission: EM | Admit: 2021-10-16 | Discharge: 2021-10-16 | Disposition: A | Discharge status: Home / Self Care | Payer: Medicare Other | Attending: Emergency Medicine | Admitting: Emergency Medicine

## 2021-10-16 ENCOUNTER — Other Ambulatory Visit: Payer: Self-pay

## 2021-10-16 ENCOUNTER — Emergency Department: Payer: Medicare Other

## 2021-10-16 DIAGNOSIS — W01198A Fall on same level from slipping, tripping and stumbling with subsequent striking against other object, initial encounter: Secondary | ICD-10-CM | POA: Insufficient documentation

## 2021-10-16 DIAGNOSIS — Y92197 Garden or yard of other specified residential institution as the place of occurrence of the external cause: Secondary | ICD-10-CM | POA: Insufficient documentation

## 2021-10-16 DIAGNOSIS — W19XXXA Unspecified fall, initial encounter: Secondary | ICD-10-CM

## 2021-10-16 DIAGNOSIS — Z794 Long term (current) use of insulin: Secondary | ICD-10-CM | POA: Diagnosis not present

## 2021-10-16 DIAGNOSIS — S0990XA Unspecified injury of head, initial encounter: Secondary | ICD-10-CM

## 2021-10-16 DIAGNOSIS — E119 Type 2 diabetes mellitus without complications: Secondary | ICD-10-CM | POA: Diagnosis not present

## 2021-10-16 DIAGNOSIS — Z79899 Other long term (current) drug therapy: Secondary | ICD-10-CM | POA: Insufficient documentation

## 2021-10-16 DIAGNOSIS — I10 Essential (primary) hypertension: Secondary | ICD-10-CM | POA: Insufficient documentation

## 2021-10-16 DIAGNOSIS — S0101XA Laceration without foreign body of scalp, initial encounter: Secondary | ICD-10-CM | POA: Insufficient documentation

## 2021-10-16 DIAGNOSIS — Y9389 Activity, other specified: Secondary | ICD-10-CM | POA: Diagnosis not present

## 2021-10-16 DIAGNOSIS — Z7982 Long term (current) use of aspirin: Secondary | ICD-10-CM | POA: Diagnosis not present

## 2021-10-16 DIAGNOSIS — Y990 Civilian activity done for income or pay: Secondary | ICD-10-CM | POA: Insufficient documentation

## 2021-10-16 MED ORDER — ONDANSETRON 4 MG PO TBDP
4.0000 mg | ORAL_TABLET | Freq: Once | ORAL | Status: AC
  Administered 2021-10-16: 4 mg via ORAL
  Filled 2021-10-16: qty 1

## 2021-10-16 MED ORDER — ACETAMINOPHEN 325 MG PO TABS
650.0000 mg | ORAL_TABLET | Freq: Once | ORAL | Status: AC
  Administered 2021-10-16: 650 mg via ORAL
  Filled 2021-10-16: qty 2

## 2021-10-16 NOTE — ED Provider Notes (Signed)
[facility] Emergency Department Provider Note   ____________________________________________    I have reviewed the triage vital signs and the nursing notes.   HISTORY  Chief Complaint Fall     HPI Holly Schneider is a 75 y.o. female with history of diabetes, hypertension who presents after mechanical fall.  Patient reports she was working in the garden, tripped over a stone and fell and struck her head.  She is not on blood thinners.  No neurological deficits.  Complains of pain in her posterior right scalp.  Bleeding is controlled at this time.  No other injuries reported.  Past Medical History:  Diagnosis Date   Arthritis    Diabetes (HCC)    HLD (hyperlipidemia)    HTN (hypertension)    Urinary incontinence    Wears dentures    partial upper and lower    Patient Active Problem List   Diagnosis Date Noted   Incomplete defecation    Mixed hyperlipidemia 11/07/2019   Cirrhosis of liver without ascites (HCC) 10/18/2018   Vaginal atrophy 06/03/2016   Benign essential hypertension 12/07/2015   Moderate mitral insufficiency 02/28/2015    Past Surgical History:  Procedure Laterality Date   COLONOSCOPY WITH PROPOFOL N/A 11/28/2019   Procedure: COLONOSCOPY WITH PROPOFOL;  Surgeon: Midge Minium, MD;  Location: [facility];  Service: Endoscopy;  Laterality: N/A;  Dioabetes - insulin   TUBAL LIGATION      Prior to Admission medications   Medication Sig Start Date End Date Taking? Authorizing Provider  aspirin EC 81 MG tablet Take 81 mg by mouth daily.     [provider]  atorvastatin (LIPITOR) 40 MG tablet  05/19/16   [provider]  ibuprofen (ADVIL) 400 MG tablet  08/01/19   [provider]  LANTUS SOLOSTAR 100 UNIT/ML Solostar Pen Inject 16 Units into the skin at bedtime.  11/12/15   [provider]  LEVEMIR FLEXTOUCH 100 UNIT/ML Pen  09/06/19   [provider]  lisinopril  (PRINIVIL,ZESTRIL) 20 MG tablet Take 20 mg by mouth daily.  10/11/14   [provider]  Misc. Devices Dan Humphreys) MISC Standard Walker for ambulation 11/06/19   Menshew, Charlesetta Ivory, PA-C  Multiple Vitamins-Minerals (PRESERVISION AREDS 2 PO) Take by mouth daily.    [provider]  psyllium (HYDROCIL/METAMUCIL) 95 % PACK Take 1 packet by mouth daily.    [provider]     Allergies Glucophage [metformin] and Mobic [meloxicam]  Family History  Problem Relation Age of Onset   Kidney disease Neg Hx    Breast cancer Neg Hx     Social History Social History   Tobacco Use   Smoking status: Never   Smokeless tobacco: Never  Vaping Use   Vaping Use: Never used  Substance Use Topics   Alcohol use: No    Alcohol/week: 0.0 standard drinks   Drug use: No    Review of Systems  Constitutional: No fever/chills Eyes: No visual changes.  ENT: No sore throat. Cardiovascular: Denies chest pain. Respiratory: Denies shortness of breath. Gastrointestinal: No abdominal pain.   Genitourinary: No groin injury Musculoskeletal: Negative for back pain.  No extremity injury Skin: Negative for rash. Neurological: Negative for headaches    ____________________________________________   PHYSICAL EXAM:  VITAL SIGNS: ED Triage Vitals  Enc Vitals Group     BP 10/16/21 1201 (!) 159/78     Pulse Rate 10/16/21 1201 76     Resp 10/16/21 1201 18  Temp 10/16/21 1201 98.3 F (36.8 C)     Temp Source 10/16/21 1201 Oral     SpO2 10/16/21 1201 93 %     Weight 10/16/21 1204 68 kg (150 lb)     Height 10/16/21 1204 1.499 m (4\' 11" )     Head Circumference --      Peak Flow --      Pain Score 10/16/21 1204 8     Pain Loc --      Pain Edu? --      Excl. in GC? --    Constitutional: Alert and oriented. No acute distress. Pleasant and interactive Eyes: Conjunctivae are normal.  Head: Approximately 3 cm laceration to the posterior right scalp Nose: No swelling or  epistaxis Mouth/Throat: Mucous membranes are moist.   Neck:  Painless ROM, no pain with axial load Cardiovascular: Normal rate, regular rhythm. Grossly normal heart sounds.  Good peripheral circulation.  No chest wall tenderness palpation Respiratory: Normal respiratory effort.  No retractions. Gastrointestinal: Soft and nontender. No distention.   Musculoskeletal: Full range of motion of all extremities, no pain with axial load on both hips.  No vertebral terms palpation Neurologic:   No gross focal neurologic deficits are appreciated.  Skin:  Skin is warm, dry and intact. No rash noted. Psychiatric: Mood and affect are normal. Speech and behavior are normal.  ____________________________________________   LABS (all labs ordered are listed, but only abnormal results are displayed)  Labs Reviewed - No data to display ____________________________________________  EKG  None ____________________________________________  RADIOLOGY  CT head and cervical spine, reviewed by me, no acute abnormalities ____________________________________________   PROCEDURES  Procedure(s) performed: yes  .10/21/22Laceration Repair  Date/Time: 10/16/2021 1:54 PM Performed by: 10/18/2021, MD Authorized by: Jene Every, MD   Consent:    Consent obtained:  Verbal   Consent given by:  Patient   Risks discussed:  Infection and pain Laceration details:    Location:  Scalp   Length (cm):  3 Treatment:    Area cleansed with:  Saline   Amount of cleaning:  Standard Skin repair:    Repair method:  Staples   Number of staples:  2 Approximation:    Approximation:  Close Post-procedure details:    Dressing:  Open (no dressing)   Critical Care performed: No ____________________________________________   INITIAL IMPRESSION / ASSESSMENT AND PLAN / ED COURSE  Pertinent labs & imaging results that were available during my care of the patient were reviewed by me and considered in my medical  decision making (see chart for details).   Patient presents after mechanical fall, suffered a small laceration to her scalp, CT head and cervical spine are reassuring    ____________________________________________   FINAL CLINICAL IMPRESSION(S) / ED DIAGNOSES  Final diagnoses:  Fall, initial encounter  Injury of head, initial encounter  Laceration of scalp, initial encounter        Note:  This document was prepared using Dragon voice recognition software and may include unintentional dictation errors.    Jene Every, MD 10/16/21 1355

## 2021-10-16 NOTE — ED Triage Notes (Signed)
Pt to ED for fall today while gardening and tripped. Hematoma noted to left side of head states hit head on bricks. Bleeding controlled at this time. Takes ASA

## 2021-10-16 NOTE — ED Notes (Signed)
See triage note  presents s/p fall  family states she was gardening and fell   laceration noted to back of head

## 2021-10-16 NOTE — ED Provider Notes (Signed)
Emergency Medicine Provider Triage Evaluation Note  Holly Schneider, a 75 y.o. female  was evaluated in triage.  History limited by [place] language, interpreter present for interview and triage.  Pt complains of accompanied by her adult daughter.  She reports a mechanical fall this morning while gardening.  She apparently tripped, hit the left side of her head and presents with bleeding and small hematoma after hitting some bricks.  Patient denies any blood thinners but does take a daily baby aspirin.  She endorses some lateral head pain as well as some neck pain at this time.  No LOC is reported.  Review of Systems  Positive: Scalp hematoma, neck pain  Negative: NV, LOC  Physical Exam  BP (!) 159/78   Pulse 76   Temp 98.3 F (36.8 C) (Oral)   Resp 18   Ht 4\' 11"  (1.499 m)   Wt 68 kg   SpO2 93%   BMI 30.30 kg/m  Gen:   Awake, no distress  NAD Resp:  Normal effort CTA MSK:   Moves extremities without difficulty  Other:  CVS: RRR  Medical Decision Making  Medically screening exam initiated at 12:13 PM.  Appropriate orders placed.  Shivonne Schwartzman was informed that the remainder of the evaluation will be completed by another provider, this initial triage assessment does not replace that evaluation, and the importance of remaining in the ED until their evaluation is complete.  Geriatric patient ED evaluation of injury sustained following a mechanical fall.  She presents to the ED for evaluation of a left scalp hematoma and neck pain following fall.   Jacklynn Bue, PA-C 10/16/21 1215    10/18/21, MD 10/16/21 [phone]

## 2022-01-21 ENCOUNTER — Other Ambulatory Visit: Payer: Self-pay | Admitting: Nurse Practitioner

## 2022-01-21 DIAGNOSIS — Z9181 History of falling: Secondary | ICD-10-CM

## 2022-01-21 DIAGNOSIS — G44309 Post-traumatic headache, unspecified, not intractable: Secondary | ICD-10-CM

## 2022-01-21 DIAGNOSIS — M542 Cervicalgia: Secondary | ICD-10-CM

## 2022-01-21 DIAGNOSIS — R413 Other amnesia: Secondary | ICD-10-CM

## 2022-01-22 ENCOUNTER — Ambulatory Visit
Admission: RE | Admit: 2022-01-22 | Discharge: 2022-01-22 | Disposition: A | Discharge status: Home / Self Care | Payer: Medicare Other | Source: Ambulatory Visit | Attending: Nurse Practitioner | Admitting: Nurse Practitioner

## 2022-01-22 ENCOUNTER — Other Ambulatory Visit: Payer: Self-pay | Admitting: Nurse Practitioner

## 2022-01-22 DIAGNOSIS — M810 Age-related osteoporosis without current pathological fracture: Secondary | ICD-10-CM | POA: Insufficient documentation

## 2022-01-31 ENCOUNTER — Ambulatory Visit
Admission: RE | Admit: 2022-01-31 | Discharge: 2022-01-31 | Disposition: A | Discharge status: Home / Self Care | Payer: Medicare Other | Source: Ambulatory Visit | Attending: Nurse Practitioner | Admitting: Nurse Practitioner

## 2022-01-31 DIAGNOSIS — G8929 Other chronic pain: Secondary | ICD-10-CM | POA: Diagnosis present

## 2022-01-31 DIAGNOSIS — M542 Cervicalgia: Secondary | ICD-10-CM | POA: Diagnosis present

## 2022-01-31 DIAGNOSIS — R413 Other amnesia: Secondary | ICD-10-CM | POA: Insufficient documentation

## 2022-01-31 DIAGNOSIS — G44309 Post-traumatic headache, unspecified, not intractable: Secondary | ICD-10-CM | POA: Diagnosis present

## 2022-01-31 DIAGNOSIS — Z9181 History of falling: Secondary | ICD-10-CM | POA: Diagnosis present

## 2022-05-12 ENCOUNTER — Other Ambulatory Visit: Payer: Self-pay | Admitting: Otolaryngology

## 2022-05-12 DIAGNOSIS — R1310 Dysphagia, unspecified: Secondary | ICD-10-CM

## 2022-05-12 DIAGNOSIS — R633 Feeding difficulties, unspecified: Secondary | ICD-10-CM

## 2022-06-10 NOTE — Progress Notes (Unsigned)
    Primary Care Physician: Theotis Burrow, MD  Primary Gastroenterologist:  Dr. Lucilla Lame  No chief complaint on file.   HPI: Holly Schneider is a 76 y.o. female here with a history of imaging showing possible early cirrhosis.  The patient has not followed up since 2020.  The patient had abnormal liver enzymes 2 years ago and had blood tests sent off that showed her to be immune to hepatitis A and B.  The patient had reported worsening heartburn and a referral was put in because the patient's primary care provider wanted the patient to be seen for dysphagia.  Past Medical History:  Diagnosis Date   Arthritis    Diabetes ([place])    HLD (hyperlipidemia)    HTN (hypertension)    Urinary incontinence    Wears dentures    partial upper and lower    Current Outpatient Medications  Medication Sig Dispense Refill   aspirin EC 81 MG tablet Take 81 mg by mouth daily.      atorvastatin (LIPITOR) 40 MG tablet      ibuprofen (ADVIL) 400 MG tablet      LANTUS SOLOSTAR 100 UNIT/ML Solostar Pen Inject 16 Units into the skin at bedtime.      LEVEMIR FLEXTOUCH 100 UNIT/ML Pen      lisinopril (PRINIVIL,ZESTRIL) 20 MG tablet Take 20 mg by mouth daily.      Misc. Devices (WALKER) MISC Standard Walker for ambulation 1 each 0   Multiple Vitamins-Minerals (PRESERVISION AREDS 2 PO) Take by mouth daily.     psyllium (HYDROCIL/METAMUCIL) 95 % PACK Take 1 packet by mouth daily.     No current facility-administered medications for this visit.    Allergies as of 06/11/2022 - Review Complete 10/16/2021  Allergen Reaction Noted   Glucophage [metformin] Diarrhea 11/21/2019   Mobic [meloxicam]  11/21/2019    ROS:  General: Negative for anorexia, weight loss, fever, chills, fatigue, weakness. ENT: Negative for hoarseness, difficulty swallowing , nasal congestion. CV: Negative for chest pain, angina, palpitations, dyspnea on exertion, peripheral edema.  Respiratory: Negative for dyspnea at  rest, dyspnea on exertion, cough, sputum, wheezing.  GI: See history of present illness. GU:  Negative for dysuria, hematuria, urinary incontinence, urinary frequency, nocturnal urination.  Endo: Negative for unusual weight change.    Physical Examination:   There were no vitals taken for this visit.  General: Well-nourished, well-developed in no acute distress.  Eyes: No icterus. Conjunctivae pink. Lungs: Clear to auscultation bilaterally. Non-labored. Heart: Regular rate and rhythm, no murmurs rubs or gallops.  Abdomen: Bowel sounds are normal, nontender, nondistended, no hepatosplenomegaly or masses, no abdominal bruits or hernia , no rebound or guarding.   Extremities: No lower extremity edema. No clubbing or deformities. Neuro: Alert and oriented x 3.  Grossly intact. Skin: Warm and dry, no jaundice.   Psych: Alert and cooperative, normal mood and affect.  Labs:    Imaging Studies: No results found.  Assessment and Plan:   Holly Schneider is a 76 y.o. y/o female ***     Lucilla Lame, MD. Marval Regal    Note: This dictation was prepared with Dragon dictation along with smaller phrase technology. Any transcriptional errors that result from this process are unintentional.

## 2022-06-11 ENCOUNTER — Encounter: Payer: Self-pay | Admitting: Gastroenterology

## 2022-06-11 ENCOUNTER — Ambulatory Visit (INDEPENDENT_AMBULATORY_CARE_PROVIDER_SITE_OTHER): Payer: Medicare Other | Admitting: Gastroenterology

## 2022-06-11 VITALS — BP 118/69 | HR 60 | Temp 98.0°F | Ht 59.0 in | Wt 121.0 lb

## 2022-06-11 DIAGNOSIS — R1310 Dysphagia, unspecified: Secondary | ICD-10-CM | POA: Diagnosis not present

## 2022-07-28 ENCOUNTER — Encounter: Payer: Self-pay | Admitting: Gastroenterology

## 2022-07-29 ENCOUNTER — Ambulatory Visit: Payer: Medicare Other | Admitting: General Practice

## 2022-07-29 ENCOUNTER — Ambulatory Visit
Admission: RE | Admit: 2022-07-29 | Discharge: 2022-07-29 | Disposition: A | Discharge status: Home / Self Care | Payer: Medicare Other | Attending: Gastroenterology | Admitting: Gastroenterology

## 2022-07-29 ENCOUNTER — Encounter: Payer: Self-pay | Admitting: Gastroenterology

## 2022-07-29 ENCOUNTER — Encounter
Admission: RE | Disposition: A | Discharge status: Home / Self Care | Payer: Self-pay | Source: Home / Self Care | Attending: Gastroenterology

## 2022-07-29 DIAGNOSIS — R1310 Dysphagia, unspecified: Secondary | ICD-10-CM | POA: Diagnosis present

## 2022-07-29 DIAGNOSIS — I10 Essential (primary) hypertension: Secondary | ICD-10-CM | POA: Insufficient documentation

## 2022-07-29 DIAGNOSIS — E119 Type 2 diabetes mellitus without complications: Secondary | ICD-10-CM | POA: Insufficient documentation

## 2022-07-29 DIAGNOSIS — Z794 Long term (current) use of insulin: Secondary | ICD-10-CM | POA: Diagnosis not present

## 2022-07-29 HISTORY — PX: ESOPHAGOGASTRODUODENOSCOPY (EGD) WITH PROPOFOL: SHX5813

## 2022-07-29 HISTORY — DX: Unspecified dementia, unspecified severity, without behavioral disturbance, psychotic disturbance, mood disturbance, and anxiety: F03.90

## 2022-07-29 LAB — GLUCOSE, CAPILLARY
Glucose-Capillary: 84 mg/dL (ref 70–99)
Glucose-Capillary: 82 mg/dL (ref 70–99)

## 2022-07-29 SURGERY — ESOPHAGOGASTRODUODENOSCOPY (EGD) WITH PROPOFOL
Anesthesia: General

## 2022-07-29 MED ORDER — LIDOCAINE HCL (CARDIAC) PF 100 MG/5ML IV SOSY
PREFILLED_SYRINGE | INTRAVENOUS | Status: DC | PRN
  Administered 2022-07-29: 100 mg via INTRAVENOUS

## 2022-07-29 MED ORDER — SODIUM CHLORIDE 0.9 % IV SOLN
INTRAVENOUS | Status: DC
Start: 2022-07-29 — End: 2022-07-29

## 2022-07-29 MED ORDER — EPHEDRINE SULFATE (PRESSORS) 50 MG/ML IJ SOLN
INTRAMUSCULAR | Status: DC | PRN
  Administered 2022-07-29: 10 mg via INTRAVENOUS

## 2022-07-29 MED ORDER — PROPOFOL 10 MG/ML IV BOLUS
INTRAVENOUS | Status: DC | PRN
  Administered 2022-07-29: 90 mg via INTRAVENOUS
  Administered 2022-07-29: 40 mg via INTRAVENOUS

## 2022-07-29 NOTE — Transfer of Care (Signed)
Immediate Anesthesia Transfer of Care Note  Patient: Holly Schneider  Procedure(s) Performed: ESOPHAGOGASTRODUODENOSCOPY (EGD) WITH PROPOFOL  Patient Location: Endoscopy Unit  Anesthesia Type:General  Level of Consciousness: drowsy  Airway & Oxygen Therapy: Patient Spontanous Breathing and Patient connected to nasal cannula oxygen  Post-op Assessment: Report given to RN, Post -op Vital signs reviewed and stable and Patient moving all extremities  Post vital signs: Reviewed and stable  Last Vitals:  Vitals Value Taken Time  BP 102/47 07/29/22 0946  Temp    Pulse 70 07/29/22 0946  Resp 15 07/29/22 0946  SpO2 98 % 07/29/22 0946  Vitals shown include unvalidated device data.  Last Pain:  Vitals:   07/29/22 0946  TempSrc:   PainSc: 0-No pain         Complications: No notable events documented.

## 2022-07-29 NOTE — Anesthesia Postprocedure Evaluation (Signed)
Anesthesia Post Note  Patient: Holly Schneider  Procedure(s) Performed: ESOPHAGOGASTRODUODENOSCOPY (EGD) WITH PROPOFOL  Patient location during evaluation: Endoscopy Anesthesia Type: General Level of consciousness: awake and alert Pain management: pain level controlled Vital Signs Assessment: post-procedure vital signs reviewed and stable Respiratory status: spontaneous breathing, nonlabored ventilation, respiratory function stable and patient connected to nasal cannula oxygen Cardiovascular status: blood pressure returned to baseline and stable Postop Assessment: no apparent nausea or vomiting Anesthetic complications: no   No notable events documented.   Last Vitals:  Vitals:   07/29/22 0905 07/29/22 0946  BP: 121/62 (!) 102/47  Resp: 14 16  Temp: (!) 36.1 C   SpO2: 100% 98%    Last Pain:  Vitals:   07/29/22 0946  TempSrc:   PainSc: 0-No pain                 Stephanie Coup

## 2022-07-29 NOTE — H&P (Signed)
Midge Minium, MD [facility] [address], [address] [place], [place] [address] Phone:[phone] Fax : [phone]  Primary Care Physician:  Preston Fleeting, MD Primary Gastroenterologist:  Dr. Servando Snare  Pre-Procedure History & Physical: HPI:  Holly Schneider is a 76 y.o. female is here for an endoscopy.   Past Medical History:  Diagnosis Date   Arthritis    Dementia (HCC)    Diabetes (HCC)    HLD (hyperlipidemia)    HTN (hypertension)    Urinary incontinence    Wears dentures    partial upper and lower    Past Surgical History:  Procedure Laterality Date   COLONOSCOPY WITH PROPOFOL N/A 11/28/2019   Procedure: COLONOSCOPY WITH PROPOFOL;  Surgeon: Midge Minium, MD;  Location: [facility];  Service: Endoscopy;  Laterality: N/A;  Dioabetes - insulin   TUBAL LIGATION      Prior to Admission medications   Medication Sig Start Date End Date Taking? Authorizing Provider  lisinopril (PRINIVIL,ZESTRIL) 20 MG tablet Take 20 mg by mouth daily.  10/11/14  Yes [provider]  aspirin EC 81 MG tablet Take 81 mg by mouth daily.     [provider]  atorvastatin (LIPITOR) 40 MG tablet  05/19/16   [provider]  Calcium Carb-Cholecalciferol (CALCIUM HIGH POTENCY/VITAMIN D) 600-5 MG-MCG TABS Take by mouth.    [provider]  celecoxib (CELEBREX) 100 MG capsule Take 100 mg by mouth 2 (two) times daily. 06/02/22   [provider]  donepezil (ARICEPT) 5 MG tablet Take 5 mg by mouth daily. 04/24/22   [provider]  ibuprofen (ADVIL) 400 MG tablet  08/01/19   [provider]  LEVEMIR FLEXTOUCH 100 UNIT/ML Pen  09/06/19   [provider]  Misc. Devices Dan Humphreys) MISC Standard Walker for ambulation 11/06/19   Menshew, Charlesetta Ivory, PA-C  Multiple Vitamins-Minerals (PRESERVISION AREDS 2 PO) Take by mouth daily.    [provider]  psyllium (HYDROCIL/METAMUCIL) 95 % PACK Take 1 packet by mouth daily.    [provider]    Allergies as of 06/11/2022 - Review Complete 06/11/2022  Allergen Reaction Noted   Glucophage [metformin] Diarrhea 11/21/2019   Mobic [meloxicam]  11/21/2019    Family History  Problem Relation Age of Onset   Kidney disease Neg Hx    Breast cancer Neg Hx     Social History   Socioeconomic History   Marital status: Widowed    Spouse name: Not on file   Number of children: Not on file   Years of education: Not on file   Highest education level: Not on file  Occupational History   Not on file  Tobacco Use   Smoking status: Never   Smokeless tobacco: Never  Vaping Use   Vaping Use: Never used  Substance and Sexual Activity   Alcohol use: No    Alcohol/week: 0.0 standard drinks of alcohol   Drug use: No   Sexual activity: Not on file  Other Topics Concern   Not on file  Social History Narrative   Not on file   Social Determinants of Health   Financial Resource Strain: Not on file  Food Insecurity: Not on file  Transportation Needs: Not on file  Physical Activity: Not on file  Stress: Not on file  Social Connections: Not on file  Intimate Partner Violence: Not on file    Review of Systems: See HPI, otherwise negative ROS  Physical Exam: BP 121/62   Temp Marland Kitchen)  96.9 F (36.1 C) (Temporal)   Resp 14   Ht 4\' 10"  (1.473 m)   Wt 55.3 kg   SpO2 100%   BMI 25.50 kg/m  General:   Alert,  pleasant and cooperative in NAD Head:  Normocephalic and atraumatic. Neck:  Supple; no masses or thyromegaly. Lungs:  Clear throughout to auscultation.    Heart:  Regular rate and rhythm. Abdomen:  Soft, nontender and nondistended. Normal bowel sounds, without guarding, and without rebound.   Neurologic:  Alert and  oriented x4;  grossly normal neurologically.  Impression/Plan: Holly Schneider is here for an endoscopy to be performed for dysphagia  Risks, benefits, limitations, and alternatives regarding  endoscopy have been reviewed with the patient.   Questions have been answered.  All parties agreeable.   Jacklynn Bue, MD  07/29/2022, 9:20 AM

## 2022-07-29 NOTE — Anesthesia Preprocedure Evaluation (Signed)
Anesthesia Evaluation  Patient identified by MRN, date of birth, ID band Patient awake    Reviewed: Allergy & Precautions, H&P , NPO status , Patient's Chart, lab work & pertinent test results  History of Anesthesia Complications Negative for: history of anesthetic complications  Airway Mallampati: I  TM Distance: >3 FB Neck ROM: full    Dental no notable dental hx. (+) Missing, Upper Dentures, Lower Dentures   Pulmonary neg shortness of breath, neg COPD, neg recent URI,    Pulmonary exam normal breath sounds clear to auscultation       Cardiovascular hypertension, (-) angina(-) CAD, (-) Past MI and (-) CABG Normal cardiovascular exam Rhythm:regular Rate:Normal     Neuro/Psych PSYCHIATRIC DISORDERS    GI/Hepatic   Endo/Other  diabetes  Renal/GU      Musculoskeletal   Abdominal   Peds  Hematology   Anesthesia Other Findings   Reproductive/Obstetrics                             Anesthesia Physical  Anesthesia Plan  ASA: 2  Anesthesia Plan: General   Post-op Pain Management: Minimal or no pain anticipated   Induction: Intravenous  PONV Risk Score and Plan: 3 and Propofol infusion, Treatment may vary due to age or medical condition and TIVA  Airway Management Planned: Natural Airway  Additional Equipment: None  Intra-op Plan:   Post-operative Plan:   Informed Consent: I have reviewed the patients History and Physical, chart, labs and discussed the procedure including the risks, benefits and alternatives for the proposed anesthesia with the patient or authorized representative who has indicated his/her understanding and acceptance.     Dental advisory given  Plan Discussed with: CRNA  Anesthesia Plan Comments: (Discussed risks of anesthesia with patient, including possibility of difficulty with spontaneous ventilation under anesthesia necessitating airway intervention, PONV,  and rare risks such as cardiac or respiratory or neurological events, and allergic reactions. Discussed the role of CRNA in patient's perioperative care. Patient understands.)        Anesthesia Quick Evaluation

## 2022-07-29 NOTE — Op Note (Signed)
[facility] Gastroenterology Patient Name: Holly Schneider Procedure Date: 07/29/2022 9:15 AM MRN: [id number] Account #: [id number] Date of Birth: Aug 23, 1946 Admit Type: Outpatient Age: 76 Room: [facility] ENDO ROOM 4 Gender: Female Note Status: Finalized Instrument Name: Upper Endoscope [id number] Procedure:             Upper GI endoscopy Indications:           Dysphagia Providers:             Midge Minium MD, MD Referring MD:          [name] Revelo (Referring MD) Medicines:             Propofol per Anesthesia Complications:         No immediate complications. Procedure:             Pre-Anesthesia Assessment:                        - Prior to the procedure, a History and Physical was                         performed, and patient medications and allergies were                         reviewed. The patient's tolerance of previous                         anesthesia was also reviewed. The risks and benefits                         of the procedure and the sedation options and risks                         were discussed with the patient. All questions were                         answered, and informed consent was obtained. Prior                         Anticoagulants: The patient has taken no previous                         anticoagulant or antiplatelet agents. ASA Grade                         Assessment: II - A patient with mild systemic disease.                         After reviewing the risks and benefits, the patient                         was deemed in satisfactory condition to undergo the                         procedure.                        After obtaining informed consent, the endoscope was  passed under direct vision. Throughout the procedure,                         the patient's blood pressure, pulse, and oxygen                         saturations were monitored continuously. The                         Endosonoscope was  introduced through the mouth, and                         advanced to the second part of duodenum. The upper GI                         endoscopy was accomplished without difficulty. The                         patient tolerated the procedure well. Findings:      The examined esophagus was normal. A TTS dilator was passed through the       scope. Dilation with a 15-16.5-18 mm balloon dilator was performed to 18       mm. The dilation site was examined following endoscope reinsertion and       showed complete resolution of luminal narrowing.      The stomach was normal.      The examined duodenum was normal. Impression:            - Normal esophagus. Dilated.                        - Normal stomach.                        - Normal examined duodenum.                        - No specimens collected. Recommendation:        - Discharge patient to home.                        - Resume previous diet.                        - Continue present medications. Procedure Code(s):     --- Professional ---                        [phone], Esophagogastroduodenoscopy, flexible,                         transoral; with transendoscopic balloon dilation of                         esophagus (less than 30 mm diameter) Diagnosis Code(s):     --- Professional ---                        R13.10, Dysphagia, unspecified CPT copyright 2019 American Medical Association. All rights reserved. The codes documented in this report are preliminary and upon coder review may  be revised to meet current compliance requirements. Bona Hubbard [employer]  MD, MD 07/29/2022 9:42:10 AM This report has been signed electronically. Number of Addenda: 0 Note Initiated On: 07/29/2022 9:15 AM Estimated Blood Loss:  Estimated blood loss: none.      [facility]

## 2022-07-30 ENCOUNTER — Encounter: Payer: Self-pay | Admitting: Gastroenterology

## 2022-08-05 ENCOUNTER — Ambulatory Visit: Payer: Medicare Other | Attending: Internal Medicine | Admitting: Speech Pathology

## 2022-08-05 DIAGNOSIS — R41841 Cognitive communication deficit: Secondary | ICD-10-CM | POA: Diagnosis present

## 2022-08-05 DIAGNOSIS — R1312 Dysphagia, oropharyngeal phase: Secondary | ICD-10-CM | POA: Insufficient documentation

## 2022-08-07 ENCOUNTER — Encounter: Payer: Self-pay | Admitting: Speech Pathology

## 2022-08-07 NOTE — Therapy (Signed)
OUTPATIENT SPEECH LANGUAGE PATHOLOGY EVALUATION   Patient Name: Holly Schneider MRN: [id number] DOB:29-Jul-1946, 76 y.o., female Today's Date: 08/07/2022  PCP: Preston Fleeting, MD REFERRING PROVIDER: Delphia Grates, MD   End of Session - 08/07/22 0843     Visit Number 1    Number of Visits 17    Date for SLP Re-Evaluation 09/30/22    Authorization Type Medicare Part A Sheryn Bison Part B    Authorization Time Period 07/29/2022 thru 09/30/2022    Authorization - Visit Number 1    Progress Note Due on Visit 10    SLP Start Time 0800    SLP Stop Time  0910    SLP Time Calculation (min) 70 min    Activity Tolerance Patient tolerated treatment well             Past Medical History:  Diagnosis Date   Arthritis    Dementia (HCC)    Diabetes (HCC)    HLD (hyperlipidemia)    HTN (hypertension)    Urinary incontinence    Wears dentures    partial upper and lower   Past Surgical History:  Procedure Laterality Date   COLONOSCOPY WITH PROPOFOL N/A 11/28/2019   Procedure: COLONOSCOPY WITH PROPOFOL;  Surgeon: Midge Minium, MD;  Location: [facility];  Service: Endoscopy;  Laterality: N/A;  Dioabetes - insulin   ESOPHAGOGASTRODUODENOSCOPY (EGD) WITH PROPOFOL N/A 07/29/2022   Procedure: ESOPHAGOGASTRODUODENOSCOPY (EGD) WITH PROPOFOL;  Surgeon: Midge Minium, MD;  Location: [facility];  Service: Endoscopy;  Laterality: N/A;   TUBAL LIGATION     Patient Active Problem List   Diagnosis Date Noted   Dysphagia    Incomplete defecation    Mixed hyperlipidemia 11/07/2019   Cirrhosis of liver without ascites (HCC) 10/18/2018   Vaginal atrophy 06/03/2016   Benign essential hypertension 12/07/2015   Moderate mitral insufficiency 02/28/2015    ONSET DATE: 06/16/2022 date of diagnosis; 07/28/2022 date of referral  REFERRING DIAG: Dysphagia, mild cognitive impairment  THERAPY DIAG:  Cognitive communication deficit  Dysphagia, oropharyngeal phase  Rationale for  Evaluation and Treatment Rehabilitation  SUBJECTIVE:   SUBJECTIVE STATEMENT: Pt pleasant, accompanied by her daughter-in-law, eager, very concerned with continued choking, live interpreter present Pt accompanied by: self, family member, and interpreter: Annice Pih  PERTINENT HISTORY: Pt medical history HTN, T2DM, arthritis, hyperlipidemia, and VHD, memory loss x several years.   06/16/2022 - ENT - Dr Geanie Logan - pt originally seen for dysphagia, pharyngoesophageal on Apr 28, 2022 with MBSS ordered (not completed) Laryngoscopy revealed evidence of reflux related irritation. Pt diagnosed with LPR  05/05/2022 Dr Theora Master Nilda Calamity, PA) Memory impairment and posttraumatic headache worsened after a fall in [place] 2022 - memory eval 20/30 -   DIAGNOSTIC FINDINGS:  EGD- Dr Midge Minium, 07/29/2022 - dilation of esophagus  MRI 01/31/2022 Mild chronic small vessel ischemic changes within the cerebral white  matter.   PAIN:  Are you having pain? No  FALLS: Has patient fallen in last 6 months?  No  LIVING ENVIRONMENT: Lives with: lives with their family Lives in: House/apartment  PLOF:  Level of assistance: Needed assistance with ADLs, Needed assistance with IADLS Employment: Retired   PATIENT GOALS stop choking when eating, concern for recent dx of dementia  OBJECTIVE:   COGNITION: Overall cognitive status: Within functional limits for tasks assessed Pt currently has dx of dementia with report of memory loss. While pt and her family report improvement with "dementia" medicine, they remain concerned and have many questions about pt's  cognition. In an effort to objectively capture pt's cognitive abilities, the [place] (SLUMS) was administered with help of live interpreter. Pt scored 21/30, raising concern for the presence of a mild neurocognitive disorder.   SLUMS Examination Orientation  3/3  Numeric Problem Solving  3/3  Memory  3/5   Attention 1/2  Thought Organization 3/3  Clock Drawing 4/4  Visuospatial Skills               2/2  Short Story Recall  4/8  Total  21/30     Scoring  High School Education  Less than High School Education   Normal  27-30 25-30  Mild Neurocognitive Disorder 21-26 20-24  Dementia  1-20 1-19    OF NOTE, PT WAS NOT ABLE TO COMPLETE THE 2ND GRADE D/T JOB RESPONSIBILITIES RELATED TO HER FAMILY'S SURVIVAL.   ORAL MOTOR EXAMINATION Facial : WFL Lingual: WFL Velum: WFL Mandible: WFL Cough: WFL Voice: WFL   CLINICAL SWALLOW ASSESSMENT:   Current diet: regular Dentition: adequate natural dentition Feeding: able to feed self  Evaluation findings: Patient presents with oropharyngeal swallow which appears clinically to be within functional limits with adequate airway protection. Oral stage is characterized by appearance of adequate oral containment, mastication, bolus formation, oral transfer and oral clearance. Swallow initiation appears timely. No overt signs of aspiration observed despite challenging with consecutive straw sips of thin liquids in excess of 3oz.  Aspiration risk factors:History of GERD, History of esophageal-related issues, and Cognitive impairment Overall aspiration risk:Moderate Diet Recommendations: regular and thin liquids Precautions:Minimize environmental distractions, Slow rate, and Small sips/bites Supervision: Patient able to feed self Oral care recommendations:Oral care BID Follow-up recommendations: MBSS and Therapy as outlined in treatment plan below      PATIENT EDUCATION: Education details: results of this assessment, ST POC, Modified Barium Swallow Study Person educated: Patient and Caregiver daughter-in-law Education method: Explanation, Demonstration, and Verbal cues Education comprehension: verbalized understanding and needs further education    GOALS: Goals reviewed with patient? Yes  SHORT TERM GOALS: Target date: 10 sessions  Pt will  complete instrumental swallow study.  Baseline: new goal Goal status: INITIAL  2.  Pt will use external memory aids to recall daily information with 90% accuracy.  Baseline: new goal Goal status: INITIAL   LONG TERM GOALS: Target date: 09/30/2022 Pt will consume least restrictive diet with minimal s/s of dysphagia or aspiration.  Baseline: new goal Goal status: to be determined following completion of instrumental swallow assessment  2.  Pt and her family will utilize external memory aids to facilitate accurate recall of information.  Baseline: new goal Goal status: INITIAL   ASSESSMENT:  CLINICAL IMPRESSION: Patient is a 76 y.o. female who was seen today for a clinical swallow and cognitive communication evaluations.  Pt presents with grossly functional oral motor abilities. Pt describes her dysphagia as a globus sensation (pointing to her hyoid region) and sensation of choking, gasping for air. while these s/s were not present today, pt and her daughter-in-law present as extremely concerned. As such, education was provided on the different phases of swallow, Modified Barium Swallow Study and reflux precautions. Pt also presents with mild neurocognitive deficits but pt also reports having a 2nd grade education. Pt and her family would benefit from instruction in use of compensatory memory startegies to increase pt's functional independence.    OBJECTIVE IMPAIRMENTS include memory and dysphagia. These impairments are limiting patient from managing medications, household responsibilities, ADLs/IADLs, effectively communicating at  home and in community, and safety when swallowing. Factors affecting potential to achieve goals and functional outcome are poor health literacy. Patient will benefit from skilled SLP services to address above impairments and improve overall function.  REHAB POTENTIAL: Good   PLAN: SLP FREQUENCY: 1-2x/week  SLP DURATION: 12 weeks  PLANNED INTERVENTIONS:  Aspiration precaution training, Diet toleration management , Cueing hierachy, Internal/external aids, SLP instruction and feedback, and Compensatory strategies   Saraia Platner B. Dreama Saa, M.S., CCC-SLP, Tree surgeon Certified Brain Injury Specialist [facility]  [facility] Rehabilitation Services Office [phone] Ascom [phone] Fax [phone]

## 2022-08-08 ENCOUNTER — Ambulatory Visit
Admission: RE | Admit: 2022-08-08 | Discharge: 2022-08-08 | Disposition: A | Discharge status: Home / Self Care | Payer: Medicare Other | Source: Ambulatory Visit | Attending: Otolaryngology | Admitting: Otolaryngology

## 2022-08-08 DIAGNOSIS — R633 Feeding difficulties, unspecified: Secondary | ICD-10-CM | POA: Insufficient documentation

## 2022-08-08 DIAGNOSIS — R1310 Dysphagia, unspecified: Secondary | ICD-10-CM | POA: Diagnosis present

## 2022-08-11 ENCOUNTER — Ambulatory Visit: Payer: Medicare Other | Admitting: Speech Pathology

## 2022-08-11 DIAGNOSIS — R41841 Cognitive communication deficit: Secondary | ICD-10-CM

## 2022-08-11 DIAGNOSIS — R1312 Dysphagia, oropharyngeal phase: Secondary | ICD-10-CM

## 2022-08-12 ENCOUNTER — Ambulatory Visit: Payer: Medicare Other | Admitting: Speech Pathology

## 2022-08-12 DIAGNOSIS — R41841 Cognitive communication deficit: Secondary | ICD-10-CM

## 2022-08-12 NOTE — Therapy (Signed)
OUTPATIENT SPEECH LANGUAGE PATHOLOGY TREATMENT NOTE   Patient Name: Holly Schneider MRN: [id number] DOB:Mar 27, 1946, 76 y.o., female Today's Date: 08/12/2022  PCP: Preston Fleeting, MD REFERRING PROVIDER: Delphia Grates, MD  END OF SESSION:   End of Session - 08/12/22 1041     Visit Number 2    Number of Visits 17    Date for SLP Re-Evaluation 09/30/22    Authorization Type Medicare Part A Sheryn Bison Part B    Authorization Time Period 07/29/2022 thru 09/30/2022    Authorization - Visit Number 2    Progress Note Due on Visit 10    SLP Start Time 0810    SLP Stop Time  0910    SLP Time Calculation (min) 60 min    Activity Tolerance Patient tolerated treatment well             Past Medical History:  Diagnosis Date   Arthritis    Dementia (HCC)    Diabetes (HCC)    HLD (hyperlipidemia)    HTN (hypertension)    Urinary incontinence    Wears dentures    partial upper and lower   Past Surgical History:  Procedure Laterality Date   COLONOSCOPY WITH PROPOFOL N/A 11/28/2019   Procedure: COLONOSCOPY WITH PROPOFOL;  Surgeon: Midge Minium, MD;  Location: [facility];  Service: Endoscopy;  Laterality: N/A;  Dioabetes - insulin   ESOPHAGOGASTRODUODENOSCOPY (EGD) WITH PROPOFOL N/A 07/29/2022   Procedure: ESOPHAGOGASTRODUODENOSCOPY (EGD) WITH PROPOFOL;  Surgeon: Midge Minium, MD;  Location: [facility];  Service: Endoscopy;  Laterality: N/A;   TUBAL LIGATION     Patient Active Problem List   Diagnosis Date Noted   Dysphagia    Incomplete defecation    Mixed hyperlipidemia 11/07/2019   Cirrhosis of liver without ascites (HCC) 10/18/2018   Vaginal atrophy 06/03/2016   Benign essential hypertension 12/07/2015   Moderate mitral insufficiency 02/28/2015    ONSET DATE: 06/16/2022 date of diagnosis; 07/28/2022 date of referral   REFERRING DIAG: Dysphagia, mild cognitive impairment   PERTINENT HISTORY: Pt medical history HTN, T2DM, arthritis, hyperlipidemia, and  VHD, memory loss x several years.    06/16/2022 - ENT - Dr Geanie Logan - pt originally seen for dysphagia, pharyngoesophageal on Apr 28, 2022 with MBSS ordered (not completed) Laryngoscopy revealed evidence of reflux related irritation. Pt diagnosed with LPR   05/05/2022 Dr Theora Master Nilda Calamity, PA) Memory impairment and posttraumatic headache worsened after a fall in [place] 2022 - memory eval 20/30 -    DIAGNOSTIC FINDINGS:  EGD- Dr Midge Minium, 07/29/2022 - dilation of esophagus   MRI 01/31/2022 Mild chronic small vessel ischemic changes within the cerebral white  matter.    THERAPY DIAG:  Cognitive communication deficit   Dysphagia, oropharyngeal phase    Rationale for Evaluation and Treatment Rehabilitation  SUBJECTIVE: They report putting 2x4 under headpost of pt's bed - "what can we do to help her (referring to pt) feel better when eating?"    Pt accompanied by: self and family member  PAIN:  Are you having pain? No  PATIENT GOALS: stop choking when eating, concern for recent dx of dementia  OBJECTIVE:   TODAY'S TREATMENT: Skilled treatment session focused on completing education on previous recommendations made by Dr Willeen Cass such as treating LPR. Education provided on LPR as well as his recommendation for 40 mg Omeperzole. Additional information provided on recommendation to schedule follow-up appt with GI (Dr Servando Snare) to review information gleamed from Modified Barium Swallow Study.  PATIENT EDUCATION: Education details: results of ENT report Person educated: Patient and Child(ren) Education method: Explanation, Verbal cues, and Handouts Education comprehension: verbalized understanding  GOALS: Goals reviewed with patient? Yes   SHORT TERM GOALS: Target date: 10 sessions   Pt will complete instrumental swallow study.  Baseline: new goal Goal status: INITIAL   2.  Pt will use external memory aids to recall daily information with 90% accuracy.   Baseline: new goal Goal status: INITIAL     LONG TERM GOALS: Target date: 09/30/2022 Pt will consume least restrictive diet with minimal s/s of dysphagia or aspiration.  Baseline: new goal Goal status: to be determined following completion of instrumental swallow assessment   2.  Pt and her family will utilize external memory aids to facilitate accurate recall of information.  Baseline: new goal Goal status: INITIAL    ASSESSMENT:  CLINICAL IMPRESSION: Pt continues to b eager to learn and apply all information provided within therapy sessions. She and her daughter-in-law report continued concern with memory. Specifically, they state that tp has bene losing her dentures, money and her glasses. Will target compensatory memory strategies during next session.    OBJECTIVE IMPAIRMENTS include memory and dysphagia. These impairments are limiting patient from effectively communicating at home and in community and safety when swallowing.  Patient will benefit from skilled SLP services to address above impairments and improve overall function.  REHAB POTENTIAL: Good  PLAN: SLP FREQUENCY: 1-2x/week  SLP DURATION: 12 weeks  PLANNED INTERVENTIONS: Aspiration precaution training, Diet toleration management , Cueing hierachy, Internal/external aids, SLP instruction and feedback, Compensatory strategies, and Patient/family education   Sharifa Bucholz B. Dreama Saa, M.S., CCC-SLP, Tree surgeon Certified Brain Injury Specialist [facility]  [facility] Rehabilitation Services Office [phone] Ascom [phone] Fax [phone]

## 2022-08-12 NOTE — Therapy (Signed)
OUTPATIENT SPEECH LANGUAGE PATHOLOGY TREATMENT NOTE   Patient Name: Holly Schneider MRN: [id number] DOB:1946-02-10, 76 y.o., female Today's Date: 08/12/2022  PCP: Preston Fleeting, MD REFERRING PROVIDER: Delphia Grates, MD  END OF SESSION:   End of Session - 08/12/22 1342     Visit Number 3    Number of Visits 17    Date for SLP Re-Evaluation 09/30/22    Authorization Type Medicare Part A Sheryn Bison Part B    Authorization Time Period 07/29/2022 thru 09/30/2022    Authorization - Visit Number 3    Progress Note Due on Visit 10    SLP Start Time 0800    SLP Stop Time  0855    SLP Time Calculation (min) 55 min    Activity Tolerance Patient tolerated treatment well             Past Medical History:  Diagnosis Date   Arthritis    Dementia (HCC)    Diabetes (HCC)    HLD (hyperlipidemia)    HTN (hypertension)    Urinary incontinence    Wears dentures    partial upper and lower   Past Surgical History:  Procedure Laterality Date   COLONOSCOPY WITH PROPOFOL N/A 11/28/2019   Procedure: COLONOSCOPY WITH PROPOFOL;  Surgeon: Midge Minium, MD;  Location: [facility];  Service: Endoscopy;  Laterality: N/A;  Dioabetes - insulin   ESOPHAGOGASTRODUODENOSCOPY (EGD) WITH PROPOFOL N/A 07/29/2022   Procedure: ESOPHAGOGASTRODUODENOSCOPY (EGD) WITH PROPOFOL;  Surgeon: Midge Minium, MD;  Location: [facility];  Service: Endoscopy;  Laterality: N/A;   TUBAL LIGATION     Patient Active Problem List   Diagnosis Date Noted   Dysphagia    Incomplete defecation    Mixed hyperlipidemia 11/07/2019   Cirrhosis of liver without ascites (HCC) 10/18/2018   Vaginal atrophy 06/03/2016   Benign essential hypertension 12/07/2015   Moderate mitral insufficiency 02/28/2015    ONSET DATE: 06/16/2022 date of diagnosis; 07/28/2022 date of referral   REFERRING DIAG: Dysphagia, mild cognitive impairment   PERTINENT HISTORY: Pt medical history HTN, T2DM, arthritis, hyperlipidemia, and  VHD, memory loss x several years.    06/16/2022 - ENT - Dr Geanie Logan - pt originally seen for dysphagia, pharyngoesophageal on Apr 28, 2022 with MBSS ordered (not completed) Laryngoscopy revealed evidence of reflux related irritation. Pt diagnosed with LPR   05/05/2022 Dr Theora Master Nilda Calamity, PA) Memory impairment and posttraumatic headache worsened after a fall in [place] 2022 - memory eval 20/30 -    DIAGNOSTIC FINDINGS:  EGD- Dr Midge Minium, 07/29/2022 - dilation of esophagus   MRI 01/31/2022 Mild chronic small vessel ischemic changes within the cerebral white  matter.    THERAPY DIAG:  Cognitive communication deficit   Dysphagia, oropharyngeal phase    Rationale for Evaluation and Treatment Rehabilitation  SUBJECTIVE: They report putting 2x4 under headpost of pt's bed - "what can we do to help her (referring to pt) feel better when eating?"    Pt accompanied by: self and family member  PAIN:  Are you having pain? No  PATIENT GOALS: stop choking when eating, concern for recent dx of dementia  OBJECTIVE:   TODAY'S TREATMENT: Skilled ST treatment session focused on instructing pt and her daughter in law in external compensatory memory strategies such as using visual reminders to place items in the same location each time, ways to facilitate preventing pt's glasses from being thrown into the trash as well as physical ways to help pt keep up with money instead  of throwing it away in the trash.     PATIENT EDUCATION: Education details: compensatory memory strategies Person educated: Patient and Child(ren) Education method: Explanation, Verbal cues, and Handouts Education comprehension: verbalized understanding  GOALS: Goals reviewed with patient? Yes   SHORT TERM GOALS: Target date: 10 sessions   Pt will complete instrumental swallow study.  Baseline: new goal Goal status: INITIAL   2.  Pt will use external memory aids to recall daily information with 90%  accuracy.  Baseline: new goal Goal status: INITIAL     LONG TERM GOALS: Target date: 09/30/2022 Pt will consume least restrictive diet with minimal s/s of dysphagia or aspiration.  Baseline: new goal Goal status: to be determined following completion of instrumental swallow assessment   2.  Pt and her family will utilize external memory aids to facilitate accurate recall of information.  Baseline: new goal Goal status: INITIAL    ASSESSMENT:  CLINICAL IMPRESSION: Pt and her daughter were eager to learn external and internal compensatory memory strategies.    OBJECTIVE IMPAIRMENTS include memory and dysphagia. These impairments are limiting patient from effectively communicating at home and in community and safety when swallowing.  Patient will benefit from skilled SLP services to address above impairments and improve overall function.  REHAB POTENTIAL: Good  PLAN: SLP FREQUENCY: 1-2x/week  SLP DURATION: 12 weeks  PLANNED INTERVENTIONS: Aspiration precaution training, Diet toleration management , Cueing hierachy, Internal/external aids, SLP instruction and feedback, Compensatory strategies, and Patient/family education   Ajay Strubel B. Dreama Saa, M.S., CCC-SLP, Tree surgeon Certified Brain Injury Specialist [facility]  [facility] Rehabilitation Services Office [phone] Ascom [phone] Fax [phone]

## 2022-08-18 ENCOUNTER — Ambulatory Visit (INDEPENDENT_AMBULATORY_CARE_PROVIDER_SITE_OTHER): Payer: Medicare Other | Admitting: Cardiovascular Disease

## 2022-08-18 ENCOUNTER — Encounter: Payer: Self-pay | Admitting: Cardiovascular Disease

## 2022-08-18 VITALS — BP 118/62 | HR 57 | Ht <= 58 in | Wt 119.6 lb

## 2022-08-18 DIAGNOSIS — Z79899 Other long term (current) drug therapy: Secondary | ICD-10-CM | POA: Diagnosis not present

## 2022-08-18 DIAGNOSIS — I10 Essential (primary) hypertension: Secondary | ICD-10-CM | POA: Diagnosis not present

## 2022-08-18 LAB — BASIC METABOLIC PANEL
BUN/Creatinine Ratio: 30 — ABNORMAL HIGH (ref 12–28)
BUN: 21 mg/dL (ref 8–27)
CO2: 21 mmol/L (ref 20–29)
Calcium: 9.7 mg/dL (ref 8.7–10.3)
Chloride: 102 mmol/L (ref 96–106)
Creatinine, Ser: 0.71 mg/dL (ref 0.57–1.00)
Glucose: 96 mg/dL (ref 70–99)
Potassium: 4.7 mmol/L (ref 3.5–5.2)
Sodium: 137 mmol/L (ref 134–144)
eGFR: 88 mL/min/{1.73_m2} (ref >59)

## 2022-08-18 NOTE — Progress Notes (Signed)
Cardiology Office Note:    Date:  08/18/2022   ID:  Holly Schneider, DOB 03-15-1946, MRN [id number]  PCP:  Holly Potters, MD   [facility] Providers Cardiologist:  Holly Schneider   Referring MD: Holly Marble, NP   Chief Complaint  Patient presents with   Heart Murmur   Hypertension         History of Present Illness:   08/18/22   Seen with daughter in law Holly Schneider)  And interpreter , Holly Schneider is a 76 y.o. female with a hx of HTN, ? Mumur  Holly Schneider provided most of the history as Holly Schneider has some degree of dementia.  She apparently has a history of hypertension and was found to be hyperkalemic.  Her blood pressure pills were reduced and she was sent to [place] for further evaluation.  No CP , no dyspne No regular exercise Is active in her garden     Past Medical History:  Diagnosis Date   Arthritis    Benign essential hypertension 12/07/2015   Cirrhosis of liver without ascites (HCC) 10/18/2018   Dementia (HCC)    Diabetes (HCC)    Dysphagia    HLD (hyperlipidemia)    HTN (hypertension)    Incomplete defecation    Mixed hyperlipidemia 11/07/2019   Moderate mitral insufficiency 02/28/2015   Urinary incontinence    Vaginal atrophy 06/03/2016   Wears dentures    partial upper and lower    Past Surgical History:  Procedure Laterality Date   COLONOSCOPY WITH PROPOFOL N/A 11/28/2019   Procedure: COLONOSCOPY WITH PROPOFOL;  Surgeon: Holly Minium, MD;  Location: [facility];  Service: Endoscopy;  Laterality: N/A;  Dioabetes - insulin   ESOPHAGOGASTRODUODENOSCOPY (EGD) WITH PROPOFOL N/A 07/29/2022   Procedure: ESOPHAGOGASTRODUODENOSCOPY (EGD) WITH PROPOFOL;  Surgeon: Holly Minium, MD;  Location: [facility];  Service: Endoscopy;  Laterality: N/A;   TUBAL LIGATION      Current Medications: Current Meds  Medication Sig   atorvastatin (LIPITOR) 40 MG tablet    Calcium Carb-Cholecalciferol (CALCIUM HIGH POTENCY/VITAMIN D) 600-5 MG-MCG TABS Take  by mouth.   celecoxib (CELEBREX) 100 MG capsule Take 100 mg by mouth 2 (two) times daily.   cetirizine (ZYRTEC) 10 MG tablet Take 10 mg by mouth daily.   donepezil (ARICEPT) 5 MG tablet Take 5 mg by mouth daily.   ibuprofen (ADVIL) 400 MG tablet Take 400 mg by mouth every 6 (six) hours as needed.   LEVEMIR FLEXTOUCH 100 UNIT/ML Pen    lisinopril (ZESTRIL) 10 MG tablet Take 10 mg by mouth daily.   Multiple Vitamins-Minerals (PRESERVISION AREDS 2 PO) Take by mouth daily.   [DISCONTINUED] Misc. Devices (WALKER) MISC Standard Walker for ambulation     Allergies:   Glucophage [metformin] and Mobic [meloxicam]   Social History   Socioeconomic History   Marital status: Widowed    Spouse name: Not on file   Number of children: Not on file   Years of education: Not on file   Highest education level: Not on file  Occupational History   Not on file  Tobacco Use   Smoking status: Never   Smokeless tobacco: Never  Vaping Use   Vaping Use: Never used  Substance and Sexual Activity   Alcohol use: No    Alcohol/week: 0.0 standard drinks of alcohol   Drug use: No   Sexual activity: Not on file  Other Topics Concern   Not on file  Social History Narrative   Not on file  Social Determinants of Health   Financial Resource Strain: Not on file  Food Insecurity: Not on file  Transportation Needs: Not on file  Physical Activity: Not on file  Stress: Not on file  Social Connections: Not on file     Family History: The patient's family history is negative for Kidney disease and Breast cancer.  ROS:   Please see the history of present illness.     All other systems reviewed and are negative.  EKGs/Labs/Other Studies Reviewed:    The following studies were reviewed today:   EKG: August 18, 2022: Sinus bradycardia 57.  Otherwise normal EKG.  Recent Labs: No results found for requested labs within last 365 days.  Recent Lipid Panel No results found for: "CHOL", "TRIG", "HDL",  "CHOLHDL", "VLDL", "LDLCALC", "LDLDIRECT"   Risk Assessment/Calculations:                Physical Exam:    VS:  BP 118/62   Pulse (!) 57   Ht 4\' 9"  (1.448 m)   Wt 119 lb 9.6 oz (54.3 kg)   SpO2 97%   BMI 25.88 kg/m     Wt Readings from Last 3 Encounters:  08/18/22 119 lb 9.6 oz (54.3 kg)  07/29/22 122 lb (55.3 kg)  06/11/22 121 lb (54.9 kg)     GEN:  Well nourished, well developed in no acute distress HEENT: Normal NECK: No JVD; No carotid bruits LYMPHATICS: No lymphadenopathy CARDIAC: RRR, no murmurs, rubs, gallops RESPIRATORY:  Clear to auscultation without rales, wheezing or rhonchi  ABDOMEN: Soft, non-tender, non-distended MUSCULOSKELETAL:  No edema; No deformity  SKIN: Warm and dry NEUROLOGIC:  Alert and oriented x 3 PSYCHIATRIC:  Normal affect   ASSESSMENT:    1. Primary hypertension   2. Medication management    PLAN:    In order of problems listed above:  Hypertension.  Her blood pressure is very well controlled on lisinopril 10 mg a day.  Advised her to watch her salt intake.  She really does not eat much salt.  She apparently has some hyperkalemia on 20 mg of lisinopril.  Her dose was decreased.  We will recheck a basic metabolic profile today.  If her potassium levels are good and I do not think we need to make any medication changes.  We will have her follow-up with her primary medical doctor in [place].           Medication Adjustments/Labs and Tests Ordered: Current medicines are reviewed at length with the patient today.  Concerns regarding medicines are outlined above.  Orders Placed This Encounter  Procedures   Basic metabolic panel   EKG 12-Lead   No orders of the defined types were placed in this encounter.   Patient Instructions  Medication Instructions:  Continue current dose of Lisinopril *If you need a refill on your cardiac medications before your next appointment, please call your pharmacy*   Lab Work: BMET Today If  you have labs (blood work) drawn today and your tests are completely normal, you will receive your results only by: MyChart Message (if you have MyChart) OR A paper copy in the mail If you have any lab test that is abnormal or we need to change your treatment, we will call you to review the results.   Testing/Procedures: NONE   Follow-Up: At [facility], you and your health needs are our priority.  As part of our continuing mission to provide you with exceptional heart care, we have created designated  Provider Care Teams.  These Care Teams include your primary Cardiologist (physician) and Advanced Practice Providers (APPs -  Physician Assistants and Nurse Practitioners) who all work together to provide you with the care you need, when you need it.  We recommend signing up for the patient portal called "MyChart".  Sign up information is provided on this After Visit Summary.  MyChart is used to connect with patients for Virtual Visits (Telemedicine).  Patients are able to view lab/test results, encounter notes, upcoming appointments, etc.  Non-urgent messages can be sent to your provider as well.   To learn more about what you can do with MyChart, go to [email or web address].    Your next appointment:   As Needed  The format for your next appointment:   In Person  Provider:   Mertie Moores, MD    Important Information About Sugar         Signed, Mertie Moores, MD  08/18/2022 8:28 AM    [place]

## 2022-08-18 NOTE — Patient Instructions (Signed)
Medication Instructions:  Continue current dose of Lisinopril *If you need a refill on your cardiac medications before your next appointment, please call your pharmacy*   Lab Work: BMET Today If you have labs (blood work) drawn today and your tests are completely normal, you will receive your results only by: MyChart Message (if you have MyChart) OR A paper copy in the mail If you have any lab test that is abnormal or we need to change your treatment, we will call you to review the results.   Testing/Procedures: NONE   Follow-Up: At [facility], you and your health needs are our priority.  As part of our continuing mission to provide you with exceptional heart care, we have created designated Provider Care Teams.  These Care Teams include your primary Cardiologist (physician) and Advanced Practice Providers (APPs -  Physician Assistants and Nurse Practitioners) who all work together to provide you with the care you need, when you need it.  We recommend signing up for the patient portal called "MyChart".  Sign up information is provided on this After Visit Summary.  MyChart is used to connect with patients for Virtual Visits (Telemedicine).  Patients are able to view lab/test results, encounter notes, upcoming appointments, etc.  Non-urgent messages can be sent to your provider as well.   To learn more about what you can do with MyChart, go to [email or web address].    Your next appointment:   As Needed  The format for your next appointment:   In Person  Provider:   Kristeen Miss, MD    Important Information About Sugar

## 2022-08-19 ENCOUNTER — Ambulatory Visit: Payer: Medicare Other | Admitting: Speech Pathology

## 2022-08-19 DIAGNOSIS — R41841 Cognitive communication deficit: Secondary | ICD-10-CM

## 2022-08-19 DIAGNOSIS — R1312 Dysphagia, oropharyngeal phase: Secondary | ICD-10-CM

## 2022-08-20 NOTE — Therapy (Signed)
OUTPATIENT SPEECH LANGUAGE PATHOLOGY TREATMENT NOTE DISCHARGE SUMMARY   Patient Name: Holly Schneider MRN: [id number] DOB:[birth year], 76 y.o., female Today's Date: 08/20/2022  PCP: Theotis Burrow, MD REFERRING PROVIDER: Morley Kos, MD  END OF SESSION:   End of Session - 08/20/22 1009     Visit Number 4    Number of Visits 17    Date for SLP Re-Evaluation 09/30/22    Authorization Type Medicare Part A [place] Part B    Authorization Time Period 07/29/2022 thru 09/30/2022    Authorization - Visit Number 4    Progress Note Due on Visit 10    SLP Start Time 0800    SLP Stop Time  0900    SLP Time Calculation (min) 60 min    Activity Tolerance Patient tolerated treatment well             Past Medical History:  Diagnosis Date   Arthritis    Benign essential hypertension 12/07/2015   Cirrhosis of liver without ascites ([place]) 10/18/2018   Dementia ([place])    Diabetes ([place])    Dysphagia    HLD (hyperlipidemia)    HTN (hypertension)    Incomplete defecation    Mixed hyperlipidemia 11/07/2019   Moderate mitral insufficiency 02/28/2015   Urinary incontinence    Vaginal atrophy 06/03/2016   Wears dentures    partial upper and lower   Past Surgical History:  Procedure Laterality Date   COLONOSCOPY WITH PROPOFOL N/A 11/28/2019   Procedure: COLONOSCOPY WITH PROPOFOL;  Surgeon: Lucilla Lame, MD;  Location: [place];  Service: Endoscopy;  Laterality: N/A;  Dioabetes - insulin   ESOPHAGOGASTRODUODENOSCOPY (EGD) WITH PROPOFOL N/A 07/29/2022   Procedure: ESOPHAGOGASTRODUODENOSCOPY (EGD) WITH PROPOFOL;  Surgeon: Lucilla Lame, MD;  Location: [facility];  Service: Endoscopy;  Laterality: N/A;   TUBAL LIGATION     Patient Active Problem List   Diagnosis Date Noted   Dysphagia    Incomplete defecation    Mixed hyperlipidemia 11/07/2019   Cirrhosis of liver without ascites ([place]) 10/18/2018   Vaginal atrophy 06/03/2016   Benign essential hypertension 12/07/2015    Moderate mitral insufficiency 02/28/2015    ONSET DATE: 06/16/2022 date of diagnosis; 07/28/2022 date of referral   REFERRING DIAG: Dysphagia, mild cognitive impairment   PERTINENT HISTORY: Pt medical history HTN, T2DM, arthritis, hyperlipidemia, and VHD, memory loss x several years.    06/16/2022 - ENT - Dr Clyde Canterbury - pt originally seen for dysphagia, pharyngoesophageal on Apr 28, 2022 with MBSS ordered (not completed) Laryngoscopy revealed evidence of reflux related irritation. Pt diagnosed with LPR   05/05/2022 Dr Gurney Maxin Holly Cook, PA) Memory impairment and posttraumatic headache worsened after a fall in [place] 2022 - memory eval 20/30 -    DIAGNOSTIC FINDINGS:  EGD- Dr Lucilla Lame, 07/29/2022 - dilation of esophagus   MRI 01/31/2022 Mild chronic small vessel ischemic changes within the cerebral white  matter.    THERAPY DIAG:  Cognitive communication deficit   Dysphagia, oropharyngeal phase    Rationale for Evaluation and Treatment Rehabilitation  SUBJECTIVE: pt reports reaction to omeprazole    Pt accompanied by: self and family member  PAIN:  Are you having pain? No  PATIENT GOALS: stop choking when eating, concern for recent dx of dementia  OBJECTIVE:   TODAY'S TREATMENT: Skilled treatment session focused on pt's dysphagia and cognition goals. SLP facilitated session by providing continued education on results of Modified Barium Swallow Study and potential for reflux. Pt's daughter to make appt with  Dr Allen Norris. SLP further facilitated session by providing further external memory strategies for establishing places for all items with label. Daughter has started this system , especially with pt's dentures. She reports that "they (dentures) haven't gotten lost again."    PATIENT EDUCATION: Education details: compensatory memory strategies Person educated: Patient and Child(ren) Education method: Explanation, Verbal cues, and Handouts Education  comprehension: verbalized understanding  GOALS: Goals reviewed with patient? Yes   SHORT TERM GOALS: Target date: 10 sessions   Pt will complete instrumental swallow study.  Baseline: new goal Goal status: MET   2.  Pt will use external memory aids to recall daily information with 90% accuracy.  Baseline: new goal Goal status: MET     LONG TERM GOALS: Target date: 09/30/2022 Pt will consume least restrictive diet with minimal s/s of dysphagia or aspiration.  Baseline: new goal Goal status: MET   2.  Pt and her family will utilize external memory aids to facilitate accurate recall of information.  Baseline: new goal Goal status: MET   ASSESSMENT:  CLINICAL IMPRESSION: Pt continues to be free of any oropharyngeal dysphagia despite continued globus sensation. Plan for pt to follow up with GI. P tand her family are also implementing external memory strategies with good success. At this time, pt has met all of her STGs and LTGs and is appropriate for discharge from skilled ST intervention.      Tenika Keeran B. Rutherford Nail, M.S., CCC-SLP, Mining engineer Certified Brain Injury [place]  [place] Office [phone] Ascom [phone] Fax [phone]

## 2022-08-22 NOTE — Progress Notes (Addendum)
Modified Barium Swallow Progress Note  Patient Details  Name: Holly Schneider MRN: [id number] Date of Birth: 1946-07-11  Today's Date: 08/08/2022  Modified Barium Swallow completed.  Full report located under Chart Review in the Imaging Section.  Brief recommendations include the following:  Clinical Impression  Pt presents with adequate oropharyngeal abiltiies when consuming thin liquids, puree, graham cracker with puree. Pt was not observed with any penetration or aspiration to explain her coughing. Of note, there appeared to be some bolus statis and retrograde movement within the esophagus (RF#14). Education provided to pt and her daughter-in-law with video playback. They voiced understanding that a follow up GI appt might be helpful.   Swallow Evaluation Recommendations   Recommended Consults: Consider GI evaluation;Consider esophageal assessment   SLP Diet Recommendations: Regular solids;Thin liquid   Liquid Administration via: Cup   Medication Administration: Whole meds with puree   Supervision: Patient able to self feed   Compensations: Minimize environmental distractions;Slow rate;Small sips/bites   Postural Changes: Remain semi-upright after after feeds/meals (Comment);Seated upright at 90 degrees   Oral Care Recommendations: Oral care BID      Aiyden Lauderback B. Dreama Saa, M.S., CCC-SLP, Tree surgeon Certified Brain Injury Specialist [facility]  [facility] Office [phone] Ascom [phone] Fax [phone]

## 2022-08-25 ENCOUNTER — Ambulatory Visit: Payer: Medicare Other | Admitting: Speech Pathology

## 2022-08-26 ENCOUNTER — Ambulatory Visit: Payer: Medicare Other | Admitting: Speech Pathology

## 2022-08-27 ENCOUNTER — Ambulatory Visit (INDEPENDENT_AMBULATORY_CARE_PROVIDER_SITE_OTHER): Payer: Medicare Other | Admitting: Dermatology

## 2022-08-27 DIAGNOSIS — B353 Tinea pedis: Secondary | ICD-10-CM

## 2022-08-27 DIAGNOSIS — Z7189 Other specified counseling: Secondary | ICD-10-CM | POA: Diagnosis not present

## 2022-08-27 DIAGNOSIS — B351 Tinea unguium: Secondary | ICD-10-CM | POA: Diagnosis not present

## 2022-08-27 MED ORDER — KETOCONAZOLE 2 % EX CREA: TOPICAL_CREAM | CUTANEOUS | 1 refills | Status: DC

## 2022-08-27 NOTE — Progress Notes (Signed)
   New Patient Visit  Subjective  Holly Schneider is a 76 y.o. female who presents for the following: New Patient (Initial Visit).  Patient presents for nail problem. Fingernails have been discolored for 3 mos, toenails x years. Pain on the tips of the toes, no treatment. No history of trauma. Pt denies having her hands in water a lot.  Patient accompanied by daughter-in-law and interpreter.  Referral from Toy Cookey, FNP at [facility].   The following portions of the chart were reviewed this encounter and updated as appropriate:       Review of Systems:  No other skin or systemic complaints except as noted in HPI or Assessment and Plan.  Objective  Well appearing patient in no apparent distress; mood and affect are within normal limits.  A focused examination was performed including face, fingernails, toenails. Relevant physical exam findings are noted in the Assessment and Plan.  toenails, fingernails Scaling of the web spaces bilateral feet; toenails painted, thickening of the left 2nd toenail, R 5th toenail; R thumb with onycholysis and greenish brown discoloration at base, nail dystrophy with disrupted nail plate at L proximal thumbnail           Assessment & Plan  Tinea pedis of both feet toenails, fingernails  With tinea unguium  Chronic and persistent condition with duration or expected duration over one year. Condition is symptomatic / bothersome to patient.  Start ketoconazole 2% cream Apply to hands/feet/web spaces BID until improved dsp 60g 1Rf.  Pt has h/o fatty liver and takes statin for chol. Pending labs start terbinafine 250mg  take 1 po QD with food dsp #30 0Rf. [facility].  Will repeat labs at f/up  Terbinafine Counseling  Terbinafine is an anti-fungal medicine that can be applied to the skin (over the counter) or taken by mouth (prescription) to treat fungal infections. The pill version is often  used to treat fungal infections of the nails or scalp. While most people do not have any side effects from taking terbinafine pills, some possible side effects of the medicine can include taste changes, headache, loss of smell, vision changes, nausea, vomiting, or diarrhea.   Rare side effects can include irritation of the liver, allergic reaction, or decrease in blood counts (which may show up as not feeling well or developing an infection). If you are concerned about any of these side effects, please stop the medicine and call your doctor, or in the case of an emergency such as feeling very unwell, seek immediate medical care.    Hepatic Function Panel - toenails, fingernails  ketoconazole (NIZORAL) 2 % cream - toenails, fingernails Apply twice daily to hands and feet/web spaces.   Return in about 1 month (around 09/27/2022) for tinea.  I10/01/2022, CMA, am acting as scribe for Cherlyn Labella, MD .  Documentation: I have reviewed the above documentation for accuracy and completeness, and I agree with the above.  Willeen Niece MD

## 2022-08-27 NOTE — Patient Instructions (Addendum)
Terbinafine Counseling  Terbinafine is an anti-fungal medicine that can be applied to the skin (over the counter) or taken by mouth (prescription) to treat fungal infections. The pill version is often used to treat fungal infections of the nails or scalp. While most people do not have any side effects from taking terbinafine pills, some possible side effects of the medicine can include taste changes, headache, loss of smell, vision changes, nausea, vomiting, or diarrhea.   Rare side effects can include irritation of the liver, allergic reaction, or decrease in blood counts (which may show up as not feeling well or developing an infection). If you are concerned about any of these side effects, please stop the medicine and call your doctor, or in the case of an emergency such as feeling very unwell, seek immediate medical care.    Due to recent changes in healthcare laws, you may see results of your pathology and/or laboratory studies on MyChart before the doctors have had a chance to review them. We understand that in some cases there may be results that are confusing or concerning to you. Please understand that not all results are received at the same time and often the doctors may need to interpret multiple results in order to provide you with the best plan of care or course of treatment. Therefore, we ask that you please give us 2 business days to thoroughly review all your results before contacting the office for clarification. Should we see a critical lab result, you will be contacted sooner.   If You Need Anything After Your Visit  If you have any questions or concerns for your doctor, please call our main line at [phone] and press option 4 to reach your doctor's medical assistant. If no one answers, please leave a voicemail as directed and we will return your call as soon as possible. Messages left after 4 pm will be answered the following business day.   You may also send us a message via  MyChart. We typically respond to MyChart messages within 1-2 business days.  For prescription refills, please ask your pharmacy to contact our office. Our fax number is [phone].  If you have an urgent issue when the clinic is closed that cannot wait until the next business day, you can page your doctor at the number below.    Please note that while we do our best to be available for urgent issues outside of office hours, we are not available 24/7.   If you have an urgent issue and are unable to reach us, you may choose to seek medical care at your doctor's office, retail clinic, urgent care center, or emergency room.  If you have a medical emergency, please immediately call 911 or go to the emergency department.  Pager Numbers  - Dr. Kowalski: [phone]  - Dr. Moye: [phone]  - Dr. Stewart: [phone]  In the event of inclement weather, please call our main line at [phone] for an update on the status of any delays or closures.  Dermatology Medication Tips: Please keep the boxes that topical medications come in in order to help keep track of the instructions about where and how to use these. Pharmacies typically print the medication instructions only on the boxes and not directly on the medication tubes.   If your medication is too expensive, please contact our office at [phone] option 4 or send us a message through MyChart.   We are unable to tell what your co-pay for medications will be   in advance as this is different depending on your insurance coverage. However, we may be able to find a substitute medication at lower cost or fill out paperwork to get insurance to cover a needed medication.   If a prior authorization is required to get your medication covered by your insurance company, please allow us 1-2 business days to complete this process.  Drug prices often vary depending on where the prescription is filled and some pharmacies may offer cheaper  prices.  The website www.goodrx.com contains coupons for medications through different pharmacies. The prices here do not account for what the cost may be with help from insurance (it may be cheaper with your insurance), but the website can give you the price if you did not use any insurance.  - You can print the associated coupon and take it with your prescription to the pharmacy.  - You may also stop by our office during regular business hours and pick up a GoodRx coupon card.  - If you need your prescription sent electronically to a different pharmacy, notify our office through [facility] MyChart or by phone at [phone] option 4.     Si Usted Necesita Algo Despus de Su Visita  Tambin puede enviarnos un mensaje a travs de MyChart. Por lo general respondemos a los mensajes de MyChart en el transcurso de 1 a 2 das hbiles.  Para renovar recetas, por favor pida a su farmacia que se ponga en contacto con nuestra oficina. Nuestro nmero de fax es el [phone].  Si tiene un asunto urgente cuando la clnica est cerrada y que no puede esperar hasta el siguiente da hbil, puede llamar/localizar a su doctor(a) al nmero que aparece a continuacin.   Por favor, tenga en cuenta que aunque hacemos todo lo posible para estar disponibles para asuntos urgentes fuera del horario de oficina, no estamos disponibles las 24 horas del da, los 7 das de la semana.   Si tiene un problema urgente y no puede comunicarse con nosotros, puede optar por buscar atencin mdica  en el consultorio de su doctor(a), en una clnica privada, en un centro de atencin urgente o en una sala de emergencias.  Si tiene una emergencia mdica, por favor llame inmediatamente al 911 o vaya a la sala de emergencias.  Nmeros de bper  - Dr. Kowalski: [phone]  - Dra. Moye: [phone]  - Dra. Stewart: [phone]  En caso de inclemencias del tiempo, por favor llame a nuestra lnea principal al [phone]  para una actualizacin sobre el estado de cualquier retraso o cierre.  Consejos para la medicacin en dermatologa: Por favor, guarde las cajas en las que vienen los medicamentos de uso tpico para ayudarle a seguir las instrucciones sobre dnde y cmo usarlos. Las farmacias generalmente imprimen las instrucciones del medicamento slo en las cajas y no directamente en los tubos del medicamento.   Si su medicamento es muy caro, por favor, pngase en contacto con nuestra oficina llamando al [phone] y presione la opcin 4 o envenos un mensaje a travs de MyChart.   No podemos decirle cul ser su copago por los medicamentos por adelantado ya que esto es diferente dependiendo de la cobertura de su seguro. Sin embargo, es posible que podamos encontrar un medicamento sustituto a menor costo o llenar un formulario para que el seguro cubra el medicamento que se considera necesario.   Si se requiere una autorizacin previa para que su compaa de seguros cubra su medicamento, por favor permtanos de 1 a   2 das hbiles para completar este proceso.  Los precios de los medicamentos varan con frecuencia dependiendo del lugar de dnde se surte la receta y alguna farmacias pueden ofrecer precios ms baratos.  El sitio web www.goodrx.com tiene cupones para medicamentos de diferentes farmacias. Los precios aqu no tienen en cuenta lo que podra costar con la ayuda del seguro (puede ser ms barato con su seguro), pero el sitio web puede darle el precio si no utiliz ningn seguro.  - Puede imprimir el cupn correspondiente y llevarlo con su receta a la farmacia.  - Tambin puede pasar por nuestra oficina durante el horario de atencin regular y recoger una tarjeta de cupones de GoodRx.  - Si necesita que su receta se enve electrnicamente a una farmacia diferente, informe a nuestra oficina a travs de MyChart de [place] o por telfono llamando al [phone] y presione la opcin 4.  

## 2022-08-28 LAB — HEPATIC FUNCTION PANEL
ALT: 50 IU/L — ABNORMAL HIGH (ref 0–32)
AST: 58 IU/L — ABNORMAL HIGH (ref 0–40)
Albumin: 4.4 g/dL (ref 3.8–4.8)
Alkaline Phosphatase: 121 IU/L (ref 44–121)
Bilirubin Total: 0.4 mg/dL (ref 0.0–1.2)
Bilirubin, Direct: 0.13 mg/dL (ref 0.00–0.40)
Total Protein: 7.7 g/dL (ref 6.0–8.5)

## 2022-09-02 ENCOUNTER — Telehealth: Payer: Self-pay

## 2022-09-02 MED ORDER — TERBINAFINE HCL 250 MG PO TABS: 250.0000 mg | ORAL_TABLET | Freq: Every day | ORAL | 0 refills | Status: DC

## 2022-09-02 NOTE — Telephone Encounter (Signed)
-----   Message from Willeen Niece, MD sent at 09/02/2022 12:26 PM EDT ----- Baseline LFTs are slightly elevated.  She has h/o fatty liver and takes statin for high chol.  Please send in terbinafine 250 mg PO qd with food #30, no rfs. [facility] - [facility] [place].  She needs 1 mo f/up appt and will recheck LFTs then before rfs are sent in.   - please call patient

## 2022-09-02 NOTE — Telephone Encounter (Signed)
Advised patient's daughter-in-law, [name], of lab results. Terbinafine 250 MG 1 po QD with food dsp #30 0Rf sent to [facility]. Patient to keep f/u appt, will check labs again in 1 month.

## 2022-09-30 ENCOUNTER — Ambulatory Visit (INDEPENDENT_AMBULATORY_CARE_PROVIDER_SITE_OTHER): Payer: Medicare Other | Admitting: Dermatology

## 2022-09-30 DIAGNOSIS — Z7189 Other specified counseling: Secondary | ICD-10-CM | POA: Diagnosis not present

## 2022-09-30 DIAGNOSIS — B353 Tinea pedis: Secondary | ICD-10-CM

## 2022-09-30 DIAGNOSIS — B351 Tinea unguium: Secondary | ICD-10-CM

## 2022-09-30 MED ORDER — KETOCONAZOLE 2 % EX CREA: TOPICAL_CREAM | CUTANEOUS | 3 refills | Status: DC

## 2022-09-30 NOTE — Patient Instructions (Signed)
Due to recent changes in healthcare laws, you may see results of your pathology and/or laboratory studies on MyChart before the doctors have had a chance to review them. We understand that in some cases there may be results that are confusing or concerning to you. Please understand that not all results are received at the same time and often the doctors may need to interpret multiple results in order to provide you with the best plan of care or course of treatment. Therefore, we ask that you please give us 2 business days to thoroughly review all your results before contacting the office for clarification. Should we see a critical lab result, you will be contacted sooner.   If You Need Anything After Your Visit  If you have any questions or concerns for your doctor, please call our main line at [phone] and press option 4 to reach your doctor's medical assistant. If no one answers, please leave a voicemail as directed and we will return your call as soon as possible. Messages left after 4 pm will be answered the following business day.   You may also send us a message via MyChart. We typically respond to MyChart messages within 1-2 business days.  For prescription refills, please ask your pharmacy to contact our office. Our fax number is [phone].  If you have an urgent issue when the clinic is closed that cannot wait until the next business day, you can page your doctor at the number below.    Please note that while we do our best to be available for urgent issues outside of office hours, we are not available 24/7.   If you have an urgent issue and are unable to reach us, you may choose to seek medical care at your doctor's office, retail clinic, urgent care center, or emergency room.  If you have a medical emergency, please immediately call 911 or go to the emergency department.  Pager Numbers  - Dr. Kowalski: [phone]  - Dr. Moye: [phone]  - Dr. Stewart:  [phone]  In the event of inclement weather, please call our main line at [phone] for an update on the status of any delays or closures.  Dermatology Medication Tips: Please keep the boxes that topical medications come in in order to help keep track of the instructions about where and how to use these. Pharmacies typically print the medication instructions only on the boxes and not directly on the medication tubes.   If your medication is too expensive, please contact our office at [phone] option 4 or send us a message through MyChart.   We are unable to tell what your co-pay for medications will be in advance as this is different depending on your insurance coverage. However, we may be able to find a substitute medication at lower cost or fill out paperwork to get insurance to cover a needed medication.   If a prior authorization is required to get your medication covered by your insurance company, please allow us 1-2 business days to complete this process.  Drug prices often vary depending on where the prescription is filled and some pharmacies may offer cheaper prices.  The website www.goodrx.com contains coupons for medications through different pharmacies. The prices here do not account for what the cost may be with help from insurance (it may be cheaper with your insurance), but the website can give you the price if you did not use any insurance.  - You can print the associated coupon and take it with   your prescription to the pharmacy.  - You may also stop by our office during regular business hours and pick up a GoodRx coupon card.  - If you need your prescription sent electronically to a different pharmacy, notify our office through [facility] MyChart or by phone at [phone] option 4.     Si Usted Necesita Algo Despus de Su Visita  Tambin puede enviarnos un mensaje a travs de MyChart. Por lo general respondemos a los mensajes de MyChart en el transcurso de 1 a 2  das hbiles.  Para renovar recetas, por favor pida a su farmacia que se ponga en contacto con nuestra oficina. Nuestro nmero de fax es el [phone].  Si tiene un asunto urgente cuando la clnica est cerrada y que no puede esperar hasta el siguiente da hbil, puede llamar/localizar a su doctor(a) al nmero que aparece a continuacin.   Por favor, tenga en cuenta que aunque hacemos todo lo posible para estar disponibles para asuntos urgentes fuera del horario de oficina, no estamos disponibles las 24 horas del da, los 7 das de la semana.   Si tiene un problema urgente y no puede comunicarse con nosotros, puede optar por buscar atencin mdica  en el consultorio de su doctor(a), en una clnica privada, en un centro de atencin urgente o en una sala de emergencias.  Si tiene una emergencia mdica, por favor llame inmediatamente al 911 o vaya a la sala de emergencias.  Nmeros de bper  - Dr. Kowalski: [phone]  - Dra. Moye: [phone]  - Dra. Stewart: [phone]  En caso de inclemencias del tiempo, por favor llame a nuestra lnea principal al [phone] para una actualizacin sobre el estado de cualquier retraso o cierre.  Consejos para la medicacin en dermatologa: Por favor, guarde las cajas en las que vienen los medicamentos de uso tpico para ayudarle a seguir las instrucciones sobre dnde y cmo usarlos. Las farmacias generalmente imprimen las instrucciones del medicamento slo en las cajas y no directamente en los tubos del medicamento.   Si su medicamento es muy caro, por favor, pngase en contacto con nuestra oficina llamando al [phone] y presione la opcin 4 o envenos un mensaje a travs de MyChart.   No podemos decirle cul ser su copago por los medicamentos por adelantado ya que esto es diferente dependiendo de la cobertura de su seguro. Sin embargo, es posible que podamos encontrar un medicamento sustituto a menor costo o llenar un formulario para que el  seguro cubra el medicamento que se considera necesario.   Si se requiere una autorizacin previa para que su compaa de seguros cubra su medicamento, por favor permtanos de 1 a 2 das hbiles para completar este proceso.  Los precios de los medicamentos varan con frecuencia dependiendo del lugar de dnde se surte la receta y alguna farmacias pueden ofrecer precios ms baratos.  El sitio web www.goodrx.com tiene cupones para medicamentos de diferentes farmacias. Los precios aqu no tienen en cuenta lo que podra costar con la ayuda del seguro (puede ser ms barato con su seguro), pero el sitio web puede darle el precio si no utiliz ningn seguro.  - Puede imprimir el cupn correspondiente y llevarlo con su receta a la farmacia.  - Tambin puede pasar por nuestra oficina durante el horario de atencin regular y recoger una tarjeta de cupones de GoodRx.  - Si necesita que su receta se enve electrnicamente a una farmacia diferente, informe a nuestra oficina a travs de MyChart de [place]   o por telfono llamando al [phone] y presione la opcin 4.  

## 2022-09-30 NOTE — Progress Notes (Signed)
   Follow-Up Visit   Subjective  Holly Schneider is a 76 y.o. female who presents for the following: Follow-up (1 month follow up on tinea pedis of toenails and fingernails. Patient here with daughter and interpretor today. Denies side effects with terbinafine and has seen some improvement. Patient needs refills for ketoconazole cream. ).   The following portions of the chart were reviewed this encounter and updated as appropriate:      Review of Systems: No other skin or systemic complaints except as noted in HPI or Assessment and Plan.   Objective  Well appearing patient in no apparent distress; mood and affect are within normal limits.  A focused examination was performed including b/l feet and toenails, and b/l fingernails . Relevant physical exam findings are noted in the Assessment and Plan.  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 Condition is symptomatic / bothersome to patient. Improving but not to goal.   Continue  ketoconazole 2% cream Apply to hands/feet/web spaces BID until improved   Pt has h/o fatty liver and takes statin for cholesterol. No side effects. Pending labs will continue terbinafine 250mg  take 1 po QD with food dsp 1 refill for total 3 month course. Patient rx sent to [facility].   Terbinafine Counseling   Terbinafine is an anti-fungal medicine that can be applied to the skin (over the counter) or taken by mouth (prescription) to treat fungal infections. The pill version is often used to treat fungal infections of the nails or scalp. While most people do not have any side effects from taking terbinafine pills, some possible side effects of the  medicine can include taste changes, headache, loss of smell, vision changes, nausea, vomiting, or diarrhea.    Rare side effects can include irritation of the liver, allergic reaction, or decrease in blood counts (which may show up as not feeling well or developing an infection). If you are concerned about any of these side effects, please stop the medicine and call your doctor, or in the case of an emergency such as feeling very unwell, seek immediate medical care.     Hepatic Function Panel - toenails and fingernails  ketoconazole (NIZORAL) 2 % cream - toenails and fingernails Apply twice daily to hands and feet/web spaces.   Return in about 2 months (around 11/30/2022) for tinea follow up. I, Ruthell Rummage, CMA, am acting as scribe for Brendolyn Patty, MD.  Documentation: I have reviewed the above documentation for accuracy and completeness, and I agree with the above.  Brendolyn Patty MD

## 2022-10-01 ENCOUNTER — Telehealth: Payer: Self-pay

## 2022-10-01 ENCOUNTER — Other Ambulatory Visit: Payer: Self-pay

## 2022-10-01 DIAGNOSIS — B353 Tinea pedis: Secondary | ICD-10-CM

## 2022-10-01 LAB — HEPATIC FUNCTION PANEL
ALT: 40 IU/L — ABNORMAL HIGH (ref 0–32)
AST: 58 IU/L — ABNORMAL HIGH (ref 0–40)
Albumin: 4.7 g/dL (ref 3.8–4.8)
Alkaline Phosphatase: 125 IU/L — ABNORMAL HIGH (ref 44–121)
Bilirubin Total: 0.3 mg/dL (ref 0.0–1.2)
Bilirubin, Direct: 0.15 mg/dL (ref 0.00–0.40)
Total Protein: 7.9 g/dL (ref 6.0–8.5)

## 2022-10-01 MED ORDER — TERBINAFINE HCL 250 MG PO TABS: 250.0000 mg | ORAL_TABLET | Freq: Every day | ORAL | 1 refills | Status: DC

## 2022-10-01 NOTE — Telephone Encounter (Signed)
-----   Message from Brendolyn Patty, MD sent at 10/01/2022  8:55 AM EDT ----- LFTs are slightly elevated, but stable from baseline (pt has fatty liver), can send in 2 rfs terbinafine - please call patient

## 2022-10-01 NOTE — Telephone Encounter (Signed)
Advised patient's daughter-in-law, Jana Half, of lab results. Jana Half stated to send refills to [facility] and not [facility]. Two refills sent in.

## 2022-12-09 ENCOUNTER — Ambulatory Visit: Payer: Medicare Other | Admitting: Dermatology

## 2023-04-11 ENCOUNTER — Emergency Department: Payer: Medicare Other

## 2023-04-11 ENCOUNTER — Other Ambulatory Visit: Payer: Self-pay

## 2023-04-11 ENCOUNTER — Inpatient Hospital Stay
Admission: EM | Admit: 2023-04-11 | Discharge: 2023-04-14 | DRG: 812 | Disposition: A | Discharge status: Home / Self Care | Payer: Medicare Other | Attending: Internal Medicine | Admitting: Internal Medicine

## 2023-04-11 DIAGNOSIS — Z9181 History of falling: Secondary | ICD-10-CM

## 2023-04-11 DIAGNOSIS — R32 Unspecified urinary incontinence: Secondary | ICD-10-CM | POA: Diagnosis present

## 2023-04-11 DIAGNOSIS — R0602 Shortness of breath: Secondary | ICD-10-CM | POA: Diagnosis present

## 2023-04-11 DIAGNOSIS — Z6821 Body mass index (BMI) 21.0-21.9, adult: Secondary | ICD-10-CM

## 2023-04-11 DIAGNOSIS — D509 Iron deficiency anemia, unspecified: Secondary | ICD-10-CM

## 2023-04-11 DIAGNOSIS — R1310 Dysphagia, unspecified: Secondary | ICD-10-CM | POA: Diagnosis present

## 2023-04-11 DIAGNOSIS — R0609 Other forms of dyspnea: Secondary | ICD-10-CM

## 2023-04-11 DIAGNOSIS — Z886 Allergy status to analgesic agent status: Secondary | ICD-10-CM

## 2023-04-11 DIAGNOSIS — M549 Dorsalgia, unspecified: Secondary | ICD-10-CM | POA: Diagnosis present

## 2023-04-11 DIAGNOSIS — R150 Incomplete defecation: Secondary | ICD-10-CM | POA: Diagnosis present

## 2023-04-11 DIAGNOSIS — R079 Chest pain, unspecified: Secondary | ICD-10-CM

## 2023-04-11 DIAGNOSIS — D649 Anemia, unspecified: Principal | ICD-10-CM | POA: Diagnosis present

## 2023-04-11 DIAGNOSIS — Z79899 Other long term (current) drug therapy: Secondary | ICD-10-CM

## 2023-04-11 DIAGNOSIS — E119 Type 2 diabetes mellitus without complications: Secondary | ICD-10-CM

## 2023-04-11 DIAGNOSIS — R634 Abnormal weight loss: Secondary | ICD-10-CM | POA: Diagnosis present

## 2023-04-11 DIAGNOSIS — W1809XA Striking against other object with subsequent fall, initial encounter: Secondary | ICD-10-CM | POA: Diagnosis present

## 2023-04-11 DIAGNOSIS — E782 Mixed hyperlipidemia: Secondary | ICD-10-CM | POA: Diagnosis present

## 2023-04-11 DIAGNOSIS — K7581 Nonalcoholic steatohepatitis (NASH): Secondary | ICD-10-CM | POA: Diagnosis present

## 2023-04-11 DIAGNOSIS — Z888 Allergy status to other drugs, medicaments and biological substances status: Secondary | ICD-10-CM

## 2023-04-11 DIAGNOSIS — E11649 Type 2 diabetes mellitus with hypoglycemia without coma: Secondary | ICD-10-CM | POA: Diagnosis present

## 2023-04-11 DIAGNOSIS — Z1152 Encounter for screening for COVID-19: Secondary | ICD-10-CM

## 2023-04-11 DIAGNOSIS — D539 Nutritional anemia, unspecified: Secondary | ICD-10-CM | POA: Diagnosis present

## 2023-04-11 DIAGNOSIS — F0390 Unspecified dementia without behavioral disturbance: Secondary | ICD-10-CM | POA: Insufficient documentation

## 2023-04-11 DIAGNOSIS — K5730 Diverticulosis of large intestine without perforation or abscess without bleeding: Secondary | ICD-10-CM | POA: Diagnosis present

## 2023-04-11 DIAGNOSIS — I10 Essential (primary) hypertension: Secondary | ICD-10-CM | POA: Diagnosis present

## 2023-04-11 DIAGNOSIS — K7460 Unspecified cirrhosis of liver: Secondary | ICD-10-CM | POA: Diagnosis present

## 2023-04-11 DIAGNOSIS — Z794 Long term (current) use of insulin: Secondary | ICD-10-CM

## 2023-04-11 DIAGNOSIS — W19XXXA Unspecified fall, initial encounter: Secondary | ICD-10-CM

## 2023-04-11 LAB — RETICULOCYTES
Immature Retic Fract: 35.2 % — ABNORMAL HIGH (ref 2.3–15.9)
RBC.: 2.86 MIL/uL — ABNORMAL LOW (ref 3.87–5.11)
Retic Count, Absolute: 67.2 10*3/uL (ref 19.0–186.0)
Retic Ct Pct: 2.4 % (ref 0.4–3.1)

## 2023-04-11 LAB — BASIC METABOLIC PANEL
Anion gap: 9 (ref 5–15)
BUN: 14 mg/dL (ref 8–23)
CO2: 20 mmol/L — ABNORMAL LOW (ref 22–32)
Calcium: 8.5 mg/dL — ABNORMAL LOW (ref 8.9–10.3)
Chloride: 110 mmol/L (ref 98–111)
Creatinine, Ser: 0.61 mg/dL (ref 0.44–1.00)
GFR, Estimated: >60 mL/min (ref >60)
Glucose, Bld: 150 mg/dL — ABNORMAL HIGH (ref 70–99)
Potassium: 4.1 mmol/L (ref 3.5–5.1)
Sodium: 139 mmol/L (ref 135–145)

## 2023-04-11 LAB — CBC
HCT: 22 % — ABNORMAL LOW (ref 36.0–46.0)
Hemoglobin: 6.1 g/dL — ABNORMAL LOW (ref 12.0–15.0)
MCH: 19.6 pg — ABNORMAL LOW (ref 26.0–34.0)
MCHC: 27.7 g/dL — ABNORMAL LOW (ref 30.0–36.0)
MCV: 70.7 fL — ABNORMAL LOW (ref 80.0–100.0)
Platelets: 189 10*3/uL (ref 150–400)
RBC: 3.11 MIL/uL — ABNORMAL LOW (ref 3.87–5.11)
RDW: 19.7 % — ABNORMAL HIGH (ref 11.5–15.5)
WBC: 6.9 10*3/uL (ref 4.0–10.5)
nRBC: 0.9 % — ABNORMAL HIGH (ref 0.0–0.2)

## 2023-04-11 LAB — HEPATIC FUNCTION PANEL
ALT: 30 U/L (ref 0–44)
AST: 51 U/L — ABNORMAL HIGH (ref 15–41)
Albumin: 3.6 g/dL (ref 3.5–5.0)
Alkaline Phosphatase: 103 U/L (ref 38–126)
Bilirubin, Direct: <0.1 mg/dL (ref 0.0–0.2)
Total Bilirubin: 0.5 mg/dL (ref 0.3–1.2)
Total Protein: 6.8 g/dL (ref 6.5–8.1)

## 2023-04-11 LAB — TYPE AND SCREEN: Antibody Screen: NEGATIVE

## 2023-04-11 LAB — LIPASE, BLOOD: Lipase: 43 U/L (ref 11–51)

## 2023-04-11 LAB — PREPARE RBC (CROSSMATCH)

## 2023-04-11 LAB — ABO/RH: ABO/RH(D): O POS

## 2023-04-11 LAB — GLUCOSE, CAPILLARY: Glucose-Capillary: 96 mg/dL (ref 70–99)

## 2023-04-11 LAB — BPAM RBC

## 2023-04-11 LAB — TROPONIN I (HIGH SENSITIVITY)
Troponin I (High Sensitivity): 6 ng/L (ref <18)
Troponin I (High Sensitivity): 4 ng/L (ref <18)

## 2023-04-11 LAB — PROTIME-INR
INR: 1.3 — ABNORMAL HIGH (ref 0.8–1.2)
Prothrombin Time: 15.7 seconds — ABNORMAL HIGH (ref 11.4–15.2)

## 2023-04-11 MED ORDER — IOHEXOL 300 MG/ML  SOLN
100.0000 mL | Freq: Once | INTRAMUSCULAR | Status: AC | PRN
  Administered 2023-04-11: 100 mL via INTRAVENOUS

## 2023-04-11 MED ORDER — SODIUM CHLORIDE 0.9 % IV SOLN
10.0000 mL/h | Freq: Once | INTRAVENOUS | Status: AC
  Administered 2023-04-11: 10 mL/h via INTRAVENOUS

## 2023-04-11 MED ORDER — ONDANSETRON HCL 4 MG/2ML IJ SOLN: 4.0000 mg | Freq: Four times a day (QID) | INTRAMUSCULAR | Status: DC | PRN

## 2023-04-11 MED ORDER — ACETAMINOPHEN 650 MG RE SUPP: 650.0000 mg | Freq: Four times a day (QID) | RECTAL | Status: DC | PRN

## 2023-04-11 MED ORDER — HYDROCODONE-ACETAMINOPHEN 5-325 MG PO TABS: 1.0000 | ORAL_TABLET | ORAL | Status: DC | PRN

## 2023-04-11 MED ORDER — SODIUM CHLORIDE 0.9 % IV BOLUS
1000.0000 mL | Freq: Once | INTRAVENOUS | Status: AC
  Administered 2023-04-11: 1000 mL via INTRAVENOUS

## 2023-04-11 MED ORDER — DONEPEZIL HCL 5 MG PO TABS
5.0000 mg | ORAL_TABLET | Freq: Every day | ORAL | Status: DC
  Administered 2023-04-12 – 2023-04-13 (×2): 5 mg via ORAL
  Filled 2023-04-11 (×3): qty 1

## 2023-04-11 MED ORDER — ACETAMINOPHEN 325 MG PO TABS
650.0000 mg | ORAL_TABLET | Freq: Four times a day (QID) | ORAL | Status: DC | PRN
  Administered 2023-04-14: 650 mg via ORAL
  Filled 2023-04-11: qty 2

## 2023-04-11 MED ORDER — ONDANSETRON HCL 4 MG PO TABS: 4.0000 mg | ORAL_TABLET | Freq: Four times a day (QID) | ORAL | Status: DC | PRN

## 2023-04-11 MED ORDER — INSULIN ASPART 100 UNIT/ML IJ SOLN
0.0000 [IU] | Freq: Three times a day (TID) | INTRAMUSCULAR | Status: DC
  Administered 2023-04-14: 2 [IU] via SUBCUTANEOUS
  Filled 2023-04-11: qty 1

## 2023-04-11 MED ORDER — INSULIN ASPART 100 UNIT/ML IJ SOLN: 0.0000 [IU] | Freq: Every day | INTRAMUSCULAR | Status: DC

## 2023-04-11 NOTE — H&P (Signed)
History and Physical    Patient: Holly Schneider ZOX:[id number] DOB: 07/24/1946 DOA: 04/11/2023 DOS: the patient was seen and examined on 04/11/2023 PCP: Gavin Potters, MD  Patient coming from: Home  Chief Complaint:  Chief Complaint  Patient presents with   Chest Pain    HPI: Holly Schneider is a 77 y.o. female with medical history significant for DM, HTN, NASH cirrhosis, dementia, prior fall in 2022 who was brought to the ED with a several day history of dyspnea on exertion, weakness, exertional chest pain that appeared to start after a mechanical fall when she tripped over a rock.  At baseline she is very active and gardens so the symptoms are out of keeping with her baseline.  She has complained of right eyelid back pain since a fall but otherwise has been ambulatory.  Prior to the fall she was reportedly in her usual state of health. ED course and data review: BP 125/58 with otherwise normal vitals.  Labs notable for hemoglobin 6.1.  Last hemoglobin on record was 13 in 2019.  BMP and hepatic function panel unremarkable except for AST 51.  Lipase normal at 43.Troponins negative at 6 and 4. EKG, personally viewed and interpreted showing NSR at 70 with nonspecific ST-T wave changes. Trauma imaging which included CT head and C-spine and CT chest abdomen and pelvis with contrast showed no evidence of internal trauma including fracture or dislocation, contusion or hematoma Patient was given an NS bolus and started on transfusion of 1 unit PRBC. Hospitalist consulted for admission.     Past Medical History:  Diagnosis Date   Arthritis    Benign essential hypertension 12/07/2015   Cirrhosis of liver without ascites 10/18/2018   Dementia    Diabetes    Dysphagia    HLD (hyperlipidemia)    HTN (hypertension)    Incomplete defecation    Mixed hyperlipidemia 11/07/2019   Moderate mitral insufficiency 02/28/2015   Urinary incontinence    Vaginal atrophy 06/03/2016   Wears dentures     partial upper and lower   Past Surgical History:  Procedure Laterality Date   COLONOSCOPY WITH PROPOFOL N/A 11/28/2019   Procedure: COLONOSCOPY WITH PROPOFOL;  Surgeon: Midge Minium, MD;  Location: [facility];  Service: Endoscopy;  Laterality: N/A;  Dioabetes - insulin   ESOPHAGOGASTRODUODENOSCOPY (EGD) WITH PROPOFOL N/A 07/29/2022   Procedure: ESOPHAGOGASTRODUODENOSCOPY (EGD) WITH PROPOFOL;  Surgeon: Midge Minium, MD;  Location: [facility];  Service: Endoscopy;  Laterality: N/A;   TUBAL LIGATION     Social History:  reports that she has never smoked. She has never used smokeless tobacco. She reports that she does not drink alcohol and does not use drugs.  Allergies  Allergen Reactions   Glucophage [Metformin] Diarrhea   Mobic [Meloxicam]     Family History  Problem Relation Age of Onset   Kidney disease Neg Hx    Breast cancer Neg Hx     Prior to Admission medications   Medication Sig Start Date End Date Taking? Authorizing Provider  atorvastatin (LIPITOR) 40 MG tablet  05/19/16   [provider]  Calcium Carb-Cholecalciferol (CALCIUM HIGH POTENCY/VITAMIN D) 600-5 MG-MCG TABS Take by mouth.    [provider]  celecoxib (CELEBREX) 100 MG capsule Take 100 mg by mouth 2 (two) times daily. 06/02/22   [provider]  cetirizine (ZYRTEC) 10 MG tablet Take 10 mg by mouth daily. 07/31/22   [provider]  donepezil (ARICEPT) 5 MG tablet Take 5 mg by mouth daily.  04/24/22   [provider]  ibuprofen (ADVIL) 400 MG tablet Take 400 mg by mouth every 6 (six) hours as needed. 08/01/19   [provider]  ketoconazole (NIZORAL) 2 % cream Apply twice daily to hands and feet/web spaces. 09/30/22   Willeen Niece, MD  LEVEMIR FLEXTOUCH 100 UNIT/ML Pen  09/06/19   [provider]  lisinopril (ZESTRIL) 10 MG tablet Take 10 mg by mouth daily. 07/24/22   [provider]  Multiple Vitamins-Minerals (PRESERVISION AREDS 2 PO) Take  by mouth daily.    [provider]  terbinafine (LAMISIL) 250 MG tablet Take 1 tablet (250 mg total) by mouth daily. Take with food. 10/01/22   Willeen Niece, MD    Physical Exam: Vitals:   04/11/23 1715 04/11/23 1718 04/11/23 1845 04/11/23 1900  BP:  (!) 125/58    Pulse:  70 63 64  Resp:  Temp:  98.6 F (37 C)    TempSrc:  Oral    SpO2:  96% 97% 97%  Weight: 52.2 kg     Height:  (1.575 m)      Physical Exam Vitals and nursing note reviewed.  Constitutional:      General: She is not in acute distress. HENT:     Head: Normocephalic and atraumatic.  Cardiovascular:     Rate and Rhythm: Normal rate and regular rhythm.     Heart sounds: Normal heart sounds.  Pulmonary:     Effort: Pulmonary effort is normal.     Breath sounds: Normal breath sounds.  Abdominal:     Palpations: Abdomen is soft.     Tenderness: There is no abdominal tenderness.  Neurological:     Mental Status: Mental status is at baseline.     Labs on Admission: I have personally reviewed following labs and imaging studies  CBC: Recent Labs  Lab 04/11/23 1717  WBC 6.9  HGB 6.1*  HCT 22.0*  MCV 70.7*  PLT 189   Basic Metabolic Panel: Recent Labs  Lab 04/11/23 1717  NA 139  K 4.1  CL 110  CO2 20*  GLUCOSE 150*  BUN 14  CREATININE 0.61  CALCIUM 8.5*   GFR: Estimated Creatinine Clearance: 47.3 mL/min (by C-G formula based on SCr of 0.61 mg/dL). Liver Function Tests: Recent Labs  Lab 04/11/23 1717  AST 51*  ALT 30  ALKPHOS 103  BILITOT 0.5  PROT 6.8  ALBUMIN 3.6   Recent Labs  Lab 04/11/23 1717  LIPASE 43   No results for input(s): "AMMONIA" in the last 168 hours. Coagulation Profile: Recent Labs  Lab 04/11/23 1720  INR 1.3*   Cardiac Enzymes: No results for input(s): "CKTOTAL", "CKMB", "CKMBINDEX", "TROPONINI" in the last 168 hours. BNP (last 3 results) No results for input(s): "PROBNP" in the last 8760 hours. HbA1C: No results for input(s):  "HGBA1C" in the last 72 hours. CBG: No results for input(s): "GLUCAP" in the last 168 hours. Lipid Profile: No results for input(s): "CHOL", "HDL", "LDLCALC", "TRIG", "CHOLHDL", "LDLDIRECT" in the last 72 hours. Thyroid Function Tests: No results for input(s): "TSH", "T4TOTAL", "FREET4", "T3FREE", "THYROIDAB" in the last 72 hours. Anemia Panel: Recent Labs    04/11/23 2023  RETICCTPCT 2.4   Urine analysis:    Component Value Date/Time   COLORURINE YELLOW (A) 02/12/2016 0735   APPEARANCEUR Clear 06/17/2016 0939   LABSPEC 1.024 02/12/2016 0735   PHURINE 5.0 02/12/2016 0735   GLUCOSEU Negative 06/17/2016 0939   HGBUR 2+ (A)  02/12/2016 0735   BILIRUBINUR Negative 06/17/2016 0939   KETONESUR NEGATIVE 02/12/2016 0735   PROTEINUR Negative 06/17/2016 0939   PROTEINUR 30 (A) 02/12/2016 0735   NITRITE Negative 06/17/2016 0939   NITRITE NEGATIVE 02/12/2016 0735   LEUKOCYTESUR Trace (A) 06/17/2016 0939    Radiological Exams on Admission: CT CHEST ABDOMEN PELVIS W CONTRAST  Result Date: 04/11/2023 CLINICAL DATA:  Polytrauma, blunt.  Fall EXAM: CT CHEST, ABDOMEN, AND PELVIS WITH CONTRAST TECHNIQUE: Multidetector CT imaging of the chest, abdomen and pelvis was performed following the standard protocol during bolus administration of intravenous contrast. RADIATION DOSE REDUCTION: This exam was performed according to the departmental dose-optimization program which includes automated exposure control, adjustment of the mA and/or kV according to patient size and/or use of iterative reconstruction technique. CONTRAST:  OMNIPAQUE IOHEXOL 300 MG/ML  SOLN COMPARISON:  None Available. FINDINGS: CHEST: Cardiovascular: No aortic injury. The thoracic aorta is normal in caliber. The heart is normal in size. No significant pericardial effusion. Mild atherosclerotic plaque. Mediastinum/Nodes: No pneumomediastinum. No mediastinal hematoma. The esophagus is unremarkable. The thyroid is unremarkable. The  central airways are patent. No hilar or axillary lymphadenopathy. Nonspecific prevascular 0.9 cm lymph node (2:26). Lungs/Pleura: No focal consolidation. Couple right lower lobe pulmonary micronodules (4: 83, 91). No pulmonary mass. No pulmonary contusion or laceration. No pneumatocele formation. No pleural effusion. No pneumothorax. No hemothorax. Musculoskeletal/Chest wall: No chest wall mass. No acute rib or sternal fracture. No spinal fracture. Bilateral shoulder degenerative changes. ABDOMEN / PELVIS: Hepatobiliary: Not enlarged. Nodular hepatic contour. No focal lesion. No laceration or subcapsular hematoma. Cholelithiasis. No gallbladder wall thickening or pericholecystic fluid. No biliary ductal dilatation. Pancreas: Normal pancreatic contour. No main pancreatic duct dilatation. Spleen: Not enlarged. No focal lesion. No laceration, subcapsular hematoma, or vascular injury. Adrenals/Urinary Tract: No nodularity bilaterally. Bilateral kidneys enhance symmetrically. No hydronephrosis. No contusion, laceration, or subcapsular hematoma. Fluid density lesion within the right kidney likely represents a simple renal cyst. Simple renal cysts, in the absence of clinically indicated signs/symptoms, require no independent follow-up. No injury to the vascular structures or collecting systems. No hydroureter. The urinary bladder is unremarkable. Stomach/Bowel: No small or large bowel wall thickening or dilatation. Colonic diverticulosis. The appendix is unremarkable. Vasculature/Lymphatics: Mild atherosclerotic plaque. No abdominal aorta or iliac aneurysm. No active contrast extravasation or pseudoaneurysm. No abdominal, pelvic, inguinal lymphadenopathy. Reproductive: Uterus and bilateral ovaries are unremarkable. No adnexal mass. Other: No simple free fluid ascites. No pneumoperitoneum. No hemoperitoneum. No mesenteric hematoma identified. No organized fluid collection. Musculoskeletal: No significant soft tissue  hematoma. No acute pelvic fracture. No spinal fracture. L2-L3 posterior disc osteophyte complex formation. Ports and Devices: None. IMPRESSION: 1. No acute intrathoracic, intra-abdominal, intrapelvic traumatic injury. 2. No acute fracture or traumatic malalignment of the thoracic or lumbar spine. 3. Other imaging findings of potential clinical significance: Question cirrhotic morphology of the liver-recommend nonemergent outpatient right upper quadrant ultrasound. Colonic diverticulosis with no acute diverticulitis. Electronically Signed   By: Tish Frederickson M.D.   On: 04/11/2023 19:42   CT Head Wo Contrast  Result Date: 04/11/2023 CLINICAL DATA:  Head trauma, minor (Age >= 65y); Neck trauma (Age >= 65y) Pt to ED for chest pressure 5/10 since 2 days. Pt fell on Tuesday and since then has been having pain in whole body also. Also is having SOB when climbs stairs at home, 13 steps. When gets to top of stairs, is out of breath. EXAM: CT HEAD WITHOUT CONTRAST CT CERVICAL SPINE WITHOUT CONTRAST TECHNIQUE: Multidetector CT  imaging of the head and cervical spine was performed following the standard protocol without intravenous contrast. Multiplanar CT image reconstructions of the cervical spine were also generated. RADIATION DOSE REDUCTION: This exam was performed according to the departmental dose-optimization program which includes automated exposure control, adjustment of the mA and/or kV according to patient size and/or use of iterative reconstruction technique. COMPARISON:  CT C-spine 10/16/2021 FINDINGS: CT HEAD FINDINGS Brain: No evidence of large-territorial acute infarction. No parenchymal hemorrhage. No mass lesion. No extra-axial collection. No mass effect or midline shift. No hydrocephalus. Basilar cisterns are patent. Vascular: No hyperdense vessel. Atherosclerotic calcifications are present within the cavernous internal carotid arteries. Skull: No acute fracture or focal lesion. Sinuses/Orbits: Paranasal  sinuses and mastoid air cells are clear. The orbits are unremarkable. Other: None. CT CERVICAL SPINE FINDINGS Alignment: Normal. Skull base and vertebrae: Multilevel moderate degenerative changes spine. Moderate left C4-C5 osseous neural foraminal stenosis. No associated severe osseous neural foraminal or central canal stenosis. No acute fracture. No aggressive appearing focal osseous lesion or focal pathologic process. Soft tissues and spinal canal: No prevertebral fluid or swelling. No visible canal hematoma. Upper chest: Unremarkable. Other: None. IMPRESSION: 1. No acute intracranial abnormality. 2. No acute displaced fracture or traumatic listhesis of the cervical spine. Electronically Signed   By: Tish Frederickson M.D.   On: 04/11/2023 19:28   CT Cervical Spine Wo Contrast  Result Date: 04/11/2023 CLINICAL DATA:  Head trauma, minor (Age >= 65y); Neck trauma (Age >= 65y) Pt to ED for chest pressure 5/10 since 2 days. Pt fell on Tuesday and since then has been having pain in whole body also. Also is having SOB when climbs stairs at home, 13 steps. When gets to top of stairs, is out of breath. EXAM: CT HEAD WITHOUT CONTRAST CT CERVICAL SPINE WITHOUT CONTRAST TECHNIQUE: Multidetector CT imaging of the head and cervical spine was performed following the standard protocol without intravenous contrast. Multiplanar CT image reconstructions of the cervical spine were also generated. RADIATION DOSE REDUCTION: This exam was performed according to the departmental dose-optimization program which includes automated exposure control, adjustment of the mA and/or kV according to patient size and/or use of iterative reconstruction technique. COMPARISON:  CT C-spine 10/16/2021 FINDINGS: CT HEAD FINDINGS Brain: No evidence of large-territorial acute infarction. No parenchymal hemorrhage. No mass lesion. No extra-axial collection. No mass effect or midline shift. No hydrocephalus. Basilar cisterns are patent. Vascular: No  hyperdense vessel. Atherosclerotic calcifications are present within the cavernous internal carotid arteries. Skull: No acute fracture or focal lesion. Sinuses/Orbits: Paranasal sinuses and mastoid air cells are clear. The orbits are unremarkable. Other: None. CT CERVICAL SPINE FINDINGS Alignment: Normal. Skull base and vertebrae: Multilevel moderate degenerative changes spine. Moderate left C4-C5 osseous neural foraminal stenosis. No associated severe osseous neural foraminal or central canal stenosis. No acute fracture. No aggressive appearing focal osseous lesion or focal pathologic process. Soft tissues and spinal canal: No prevertebral fluid or swelling. No visible canal hematoma. Upper chest: Unremarkable. Other: None. IMPRESSION: 1. No acute intracranial abnormality. 2. No acute displaced fracture or traumatic listhesis of the cervical spine. Electronically Signed   By: Tish Frederickson M.D.   On: 04/11/2023 19:28   DG Chest 2 View  Result Date: 04/11/2023 CLINICAL DATA:  Chest pain and dyspnea with exertion. EXAM: CHEST - 2 VIEW COMPARISON:  Chest x-ray dated 02/12/2016 FINDINGS: Heart size and mediastinal contours are stable. Lungs are clear. No pleural effusion or pneumothorax is seen. Osseous structures about the chest are  unremarkable. IMPRESSION: No active cardiopulmonary disease. No evidence of pneumonia or pulmonary edema. Electronically Signed   By: Bary Richard M.D.   On: 04/11/2023 17:32     Data Reviewed: Relevant notes from primary care and specialist visits, past discharge summaries as available in EHR, including Care Everywhere. Prior diagnostic testing as pertinent to current admission diagnoses Updated medications and problem lists for reconciliation ED course, including vitals, labs, imaging, treatment and response to treatment Triage notes, nursing and pharmacy notes and ED provider's notes Notable results as noted in HPI   Assessment and Plan: * Symptomatic anemia ?   Blood loss anemia Hemoglobin 6.1 but no recent hemoglobin.  Last Hb of 13 in 2019 Trauma imaging and the ED negative for internal source of bleeding related to recent fall Had normal EGD 2023 and colonoscopy 2020 that showed diverticulosis No reports of bloody or black stool - Continue transfusion of PRBCs started in the ED - Serial H&H posttransfusion - Anemia panel added onto pretransfusion labs  Chest pain Exertional chest pain, likely related to symptomatic anemia Troponin negative x 2 and EKG nonacute  Diabetes Sliding scale insulin coverage  Personal history of fall Fall may have been accidental or related to presyncope from an already existing anemia given no recent hemoglobin on record Fall precautions  Dementia Continue donepezil  Liver cirrhosis secondary to NASH Patient is followed by GI and had extensive workup in 2018-2019 CT abdomen showed cirrhotic morphology of the liver  Benign essential hypertension Will hold antihypertensives in view of symptomatic anemia, in case of hypotension    DVT prophylaxis: SCD  Consults: none  Advance Care Planning: full code  Family Communication: daughter in Neurosurgeon at bedside  Disposition Plan: Back to previous home environment  Severity of Illness: The appropriate patient status for this patient is OBSERVATION. Observation status is judged to be reasonable and necessary in order to provide the required intensity of service to ensure the patient's safety. The patient's presenting symptoms, physical exam findings, and initial radiographic and laboratory data in the context of their medical condition is felt to place them at decreased risk for further clinical deterioration. Furthermore, it is anticipated that the patient will be medically stable for discharge from the hospital within 2 midnights of admission.   Author: Andris Baumann, MD 04/11/2023 8:52 PM  For on call review [email or web address].

## 2023-04-11 NOTE — ED Triage Notes (Signed)
Pt to ED for chest pressure 5/10 since 2 days.   Pt fell on Tuesday and since then has been having pain in whole body also. Also is having SOB when climbs stairs at home, 13 steps. When gets to top of stairs, is out of breath. This started about 4 days ago.   Pt also has hx dementia, accompanied by daughter who is POA and also speaks [place].   Used to take daily aspirin and BP med, PCP took her off about 8 months ago.   [employer] [facility] sent also.

## 2023-04-11 NOTE — ED Provider Notes (Addendum)
[facility] Provider Note    Event Date/Time   First MD Initiated Contact with Patient 04/11/23 1820     (approximate)   History   Chest Pain   HPI  Amita Atayde is a 77 y.o. female   Past medical history of liver cirrhosis, hyperlipidemia, hypertension, dementia, diabetes, who presents to the emergency department with generalized weakness, exertional dyspnea, exertional chest pain over the last several days after sustaining an mechanical slip and fall when she tripped over a rock on Tuesday.  She is normally very active and engages in gardening and various physical activities, and after her fall on Tuesday has been very weak.  She states that she tripped over a rock on Tuesday and fell did not strike her head or lose consciousness but did injure her right side of her buttock.  She has been ambulatory since then.  She reports no other acute medical complaints and has been in her regular state of health before her fall.  She denies any symptoms at rest and no GI bleeding.  She is not on blood thinners  Independent Historian contributed to assessment above: Her daughter-in-law is at bedside to corroborate past medical history  External Medical Documents Reviewed: Upper endoscopy performed in August 2023 which was normal      Physical Exam   Triage Vital Signs: ED Triage Vitals  Enc Vitals Group     BP 04/11/23 1718 (!) 125/58     Pulse Rate 04/11/23 1718 70     Resp 04/11/23 1718 20     Temp 04/11/23 1718 98.6 F (37 C)     Temp Source 04/11/23 1718 Oral     SpO2 04/11/23 1718 96 %     Weight 04/11/23 1715 115 lb (52.2 kg)     Height 04/11/23 1715  (1.575 m)     Head Circumference --      Peak Flow --      Pain Score 04/11/23 1714 5     Pain Loc --      Pain Edu? --      Excl. in GC? --     Most recent vital signs: Vitals:   04/11/23 1845 04/11/23 1900  BP:    Pulse: 63 64  Resp: 17 17  Temp:    SpO2: 97% 97%     General: Awake, no distress.  CV:  Good peripheral perfusion.  Resp:  Normal effort.  Abd:  No distention.  Other:  Awake alert oriented with normal vital signs and comfortable appearing in the stretcher.  Moving all extremities with full active range of motion neurovascular intact with no obvious signs of head or neck trauma, no signs of trauma to the torso no tenderness to palpation, mild periumbilical tenderness to palpation in the abdomen without overlying signs of injury, rectal exam has some yellow stool with no blood or melena.  No flank tenderness or signs of injury to the back step-off or deformity to the T or L-spine.  She has some minor bruising to the right buttocks.   ED Results / Procedures / Treatments   Labs (all labs ordered are listed, but only abnormal results are displayed) Labs Reviewed  BASIC METABOLIC PANEL - Abnormal; Notable for the following components:      Result Value   CO2 20 (*)    Glucose, Bld 150 (*)    Calcium 8.5 (*)    All other components within normal limits  CBC - Abnormal; Notable  for the following components:   RBC 3.11 (*)    Hemoglobin 6.1 (*)    HCT 22.0 (*)    MCV 70.7 (*)    MCH 19.6 (*)    MCHC 27.7 (*)    RDW 19.7 (*)    nRBC 0.9 (*)    All other components within normal limits  PROTIME-INR - Abnormal; Notable for the following components:   Prothrombin Time 15.7 (*)    INR 1.3 (*)    All other components within normal limits  HEPATIC FUNCTION PANEL - Abnormal; Notable for the following components:   AST 51 (*)    All other components within normal limits  LIPASE, BLOOD  PREPARE RBC (CROSSMATCH)  TYPE AND SCREEN  ABO/RH  TROPONIN I (HIGH SENSITIVITY)  TROPONIN I (HIGH SENSITIVITY)     I ordered and reviewed the above labs they are notable for she has a hemoglobin of 6.1 microcytic  EKG  ED ECG REPORT I, Pilar Jarvis, the attending physician, personally viewed and interpreted this ECG.   Date: 04/11/2023  EKG Time:  1714  Rate: 70  Rhythm: nsr  Axis: nl  Intervals: none  ST&T Change: T wave inversions in the inferior leads    RADIOLOGY I independently reviewed and interpreted chest x-ray and see no obvious focality or pneumothorax   PROCEDURES:  Critical Care performed: Yes, see critical care procedure note(s)  .Critical Care  Performed by: Pilar Jarvis, MD Authorized by: Pilar Jarvis, MD   Critical care provider statement:    Critical care time (minutes):  30   Critical care was time spent personally by me on the following activities:  Development of treatment plan with patient or surrogate, discussions with consultants, evaluation of patient's response to treatment, examination of patient, ordering and review of laboratory studies, ordering and review of radiographic studies, ordering and performing treatments and interventions, pulse oximetry, re-evaluation of patient's condition and review of old charts    MEDICATIONS ORDERED IN ED: Medications  0.9 %  sodium chloride infusion (has no administration in time range)  sodium chloride 0.9 % bolus 1,000 mL (0 mLs Intravenous Stopped 04/11/23 1915)  iohexol (OMNIPAQUE) 300 MG/ML solution 100 mL (100 mLs Intravenous Contrast Given 04/11/23 1904)    External physician / consultants:  I spoke with hospitalist for admission and regarding care plan for this patient.   IMPRESSION / MDM / ASSESSMENT AND PLAN / ED COURSE  I reviewed the triage vital signs and the nursing notes.                                Patient's presentation is most consistent with acute presentation with potential threat to life or bodily function.  Differential diagnosis includes, but is not limited to, acute blood loss anemia, traumatic injury with fractures or internal bleeding, intracranial bleeding, intrathoracic or intra-abdominal bleeding, hematoma to the buttocks, GI bleeding   The patient is on the cardiac monitor to evaluate for evidence of arrhythmia and/or  significant heart rate changes.  MDM: This is a patient who appears to be anemic and most likely from blood loss from trauma.  She was in her regular state of health before the trauma sustained on Tuesday and since then has had symptoms of anemia with exertional dyspnea, exertional chest pain generalized weakness.  There is no history of GI bleeding her rectal exam is negative.  Aside from some minor bruising to her right buttocks  there is no obvious signs of traumatic injury on my exam she does have some mild periumbilical tenderness.  I will get a CT pan scan CT head, neck, chest abdomen pelvis with IV contrast and we will order for 2 units of packed red blood cell transfusion.   She remains hemodynamically stable and her CT scan showed no traumatic injuries to account for her anemia.  Rectal exam is negative for bleeding, doubt GI bleeding.  However given her rapid decline since her fall on Tuesday with significant anemia no obvious source, requiring blood transfusion, I will admit her for further observation.        FINAL CLINICAL IMPRESSION(S) / ED DIAGNOSES   Final diagnoses:  Symptomatic anemia  Microcytic anemia  Fall, initial encounter  Exertional dyspnea  Nonspecific chest pain     Rx / DC Orders   ED Discharge Orders     None        Note:  This document was prepared using Dragon voice recognition software and may include unintentional dictation errors.    Pilar Jarvis, MD 04/11/23 Mila Merry    Pilar Jarvis, MD 04/11/23 Rosamaria Lints

## 2023-04-11 NOTE — Assessment & Plan Note (Signed)
Sliding scale insulin coverage 

## 2023-04-11 NOTE — Assessment & Plan Note (Signed)
Fall may have been accidental or related to presyncope from an already existing anemia given no recent hemoglobin on record Fall precautions

## 2023-04-11 NOTE — ED Notes (Signed)
Pt transported to CT ?

## 2023-04-11 NOTE — Assessment & Plan Note (Signed)
?    Blood loss anemia Hemoglobin 6.1 but no recent hemoglobin.  Last Hb of 13 in 2019 Trauma imaging and the ED negative for internal source of bleeding related to recent fall Had normal EGD 2023 and colonoscopy 2020 that showed diverticulosis No reports of bloody or black stool - Continue transfusion of PRBCs started in the ED - Serial H&H posttransfusion - Anemia panel added onto pretransfusion labs

## 2023-04-11 NOTE — Assessment & Plan Note (Signed)
Exertional chest pain, likely related to symptomatic anemia Troponin negative x 2 and EKG nonacute

## 2023-04-11 NOTE — Assessment & Plan Note (Signed)
Continue donepezil 

## 2023-04-11 NOTE — Assessment & Plan Note (Signed)
Will hold antihypertensives in view of symptomatic anemia, in case of hypotension

## 2023-04-11 NOTE — Assessment & Plan Note (Signed)
Patient is followed by GI and had extensive workup in 2018-2019 CT abdomen showed cirrhotic morphology of the liver

## 2023-04-12 ENCOUNTER — Encounter: Payer: Self-pay | Admitting: Internal Medicine

## 2023-04-12 DIAGNOSIS — R079 Chest pain, unspecified: Secondary | ICD-10-CM | POA: Diagnosis present

## 2023-04-12 DIAGNOSIS — R1310 Dysphagia, unspecified: Secondary | ICD-10-CM | POA: Diagnosis present

## 2023-04-12 DIAGNOSIS — E11649 Type 2 diabetes mellitus with hypoglycemia without coma: Secondary | ICD-10-CM | POA: Diagnosis present

## 2023-04-12 DIAGNOSIS — Z9181 History of falling: Secondary | ICD-10-CM | POA: Diagnosis not present

## 2023-04-12 DIAGNOSIS — R150 Incomplete defecation: Secondary | ICD-10-CM | POA: Diagnosis present

## 2023-04-12 DIAGNOSIS — F0390 Unspecified dementia without behavioral disturbance: Secondary | ICD-10-CM | POA: Diagnosis present

## 2023-04-12 DIAGNOSIS — K5730 Diverticulosis of large intestine without perforation or abscess without bleeding: Secondary | ICD-10-CM | POA: Diagnosis present

## 2023-04-12 DIAGNOSIS — R0602 Shortness of breath: Secondary | ICD-10-CM | POA: Diagnosis present

## 2023-04-12 DIAGNOSIS — Z888 Allergy status to other drugs, medicaments and biological substances status: Secondary | ICD-10-CM | POA: Diagnosis not present

## 2023-04-12 DIAGNOSIS — K7460 Unspecified cirrhosis of liver: Secondary | ICD-10-CM | POA: Diagnosis not present

## 2023-04-12 DIAGNOSIS — Z886 Allergy status to analgesic agent status: Secondary | ICD-10-CM | POA: Diagnosis not present

## 2023-04-12 DIAGNOSIS — I10 Essential (primary) hypertension: Secondary | ICD-10-CM | POA: Diagnosis present

## 2023-04-12 DIAGNOSIS — D539 Nutritional anemia, unspecified: Secondary | ICD-10-CM | POA: Diagnosis present

## 2023-04-12 DIAGNOSIS — D500 Iron deficiency anemia secondary to blood loss (chronic): Secondary | ICD-10-CM | POA: Diagnosis not present

## 2023-04-12 DIAGNOSIS — Z794 Long term (current) use of insulin: Secondary | ICD-10-CM | POA: Diagnosis not present

## 2023-04-12 DIAGNOSIS — Z6821 Body mass index (BMI) 21.0-21.9, adult: Secondary | ICD-10-CM | POA: Diagnosis not present

## 2023-04-12 DIAGNOSIS — K7581 Nonalcoholic steatohepatitis (NASH): Secondary | ICD-10-CM

## 2023-04-12 DIAGNOSIS — M549 Dorsalgia, unspecified: Secondary | ICD-10-CM | POA: Diagnosis present

## 2023-04-12 DIAGNOSIS — Z79899 Other long term (current) drug therapy: Secondary | ICD-10-CM | POA: Diagnosis not present

## 2023-04-12 DIAGNOSIS — R32 Unspecified urinary incontinence: Secondary | ICD-10-CM | POA: Diagnosis present

## 2023-04-12 DIAGNOSIS — W1809XA Striking against other object with subsequent fall, initial encounter: Secondary | ICD-10-CM | POA: Diagnosis present

## 2023-04-12 DIAGNOSIS — R634 Abnormal weight loss: Secondary | ICD-10-CM | POA: Diagnosis present

## 2023-04-12 DIAGNOSIS — E782 Mixed hyperlipidemia: Secondary | ICD-10-CM | POA: Diagnosis present

## 2023-04-12 DIAGNOSIS — Z1152 Encounter for screening for COVID-19: Secondary | ICD-10-CM | POA: Diagnosis not present

## 2023-04-12 DIAGNOSIS — D509 Iron deficiency anemia, unspecified: Secondary | ICD-10-CM | POA: Diagnosis not present

## 2023-04-12 DIAGNOSIS — D649 Anemia, unspecified: Secondary | ICD-10-CM | POA: Diagnosis not present

## 2023-04-12 LAB — GLUCOSE, CAPILLARY
Glucose-Capillary: 96 mg/dL (ref 70–99)
Glucose-Capillary: 98 mg/dL (ref 70–99)
Glucose-Capillary: 81 mg/dL (ref 70–99)
Glucose-Capillary: 95 mg/dL (ref 70–99)

## 2023-04-12 LAB — IRON AND TIBC
Iron: 12 ug/dL — ABNORMAL LOW (ref 28–170)
Saturation Ratios: 3 % — ABNORMAL LOW (ref 10.4–31.8)
TIBC: 476 ug/dL — ABNORMAL HIGH (ref 250–450)
UIBC: 464 ug/dL

## 2023-04-12 LAB — FOLATE: Folate: 25 ng/mL (ref >5.9)

## 2023-04-12 LAB — BPAM RBC
Blood Product Expiration Date: 202405202359
Unit Type and Rh: 5100
Unit Type and Rh: 5100

## 2023-04-12 LAB — FERRITIN: Ferritin: 4 ng/mL — ABNORMAL LOW (ref 11–307)

## 2023-04-12 LAB — VITAMIN B12: Vitamin B-12: 309 pg/mL (ref 180–914)

## 2023-04-12 LAB — TYPE AND SCREEN
ABO/RH(D): O POS
Unit division: 0

## 2023-04-12 LAB — HEMOGLOBIN
Hemoglobin: 9.7 g/dL — ABNORMAL LOW (ref 12.0–15.0)
Hemoglobin: 10.2 g/dL — ABNORMAL LOW (ref 12.0–15.0)

## 2023-04-12 LAB — HEMOGLOBIN A1C
Hgb A1c MFr Bld: 6.1 % — ABNORMAL HIGH (ref 4.8–5.6)
Mean Plasma Glucose: 128.37 mg/dL

## 2023-04-12 MED ORDER — SODIUM CHLORIDE 0.9 % IV SOLN
200.0000 mg | Freq: Once | INTRAVENOUS | Status: AC
  Administered 2023-04-12: 200 mg via INTRAVENOUS
  Filled 2023-04-12: qty 200

## 2023-04-12 MED ORDER — PEG 3350-KCL-NA BICARB-NACL 420 G PO SOLR
4000.0000 mL | Freq: Once | ORAL | Status: AC
  Administered 2023-04-12: 4000 mL via ORAL
  Filled 2023-04-12: qty 4000

## 2023-04-12 MED ORDER — LISINOPRIL 10 MG PO TABS
10.0000 mg | ORAL_TABLET | Freq: Every day | ORAL | Status: DC
  Administered 2023-04-12: 10 mg via ORAL
  Filled 2023-04-12: qty 1

## 2023-04-12 MED ORDER — SODIUM CHLORIDE 0.9 % IV SOLN: INTRAVENOUS | Status: DC

## 2023-04-12 MED ORDER — LORATADINE 10 MG PO TABS
10.0000 mg | ORAL_TABLET | Freq: Every day | ORAL | Status: DC
  Administered 2023-04-12 – 2023-04-14 (×3): 10 mg via ORAL
  Filled 2023-04-12 (×2): qty 1

## 2023-04-12 MED ORDER — ATORVASTATIN CALCIUM 20 MG PO TABS
20.0000 mg | ORAL_TABLET | Freq: Every day | ORAL | Status: DC
  Administered 2023-04-12 – 2023-04-13 (×2): 20 mg via ORAL
  Filled 2023-04-12 (×2): qty 1

## 2023-04-12 NOTE — Progress Notes (Addendum)
Progress Note    Holly Schneider  ZOX:[id number] DOB: October 12, 1946  DOA: 04/11/2023 PCP: Gavin Potters, MD      Brief Narrative:    Medical records reviewed and are as summarized below:  Holly Schneider is a 77 y.o. female with medical history significant for diabetes mellitus, hypertension, Holly Schneider, dementia, history of fall in 2022, who presented to the hospital because of shortness of breath with exertion, general weakness, exertional chest pain and mechanical fall.  Reportedly, patient tripped over her walker and fell.  Hemoglobin in the ED emergency department was 6.1.  She was admitted to the hospital for severe anemia.  She was transfused with 2 units of packed red blood cells.        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   Symptomatic, severe anemia; iron deficiency anemia: Suspected blood loss anemia.  Hemoglobin is up to 10.2.  S/p transfusion with 2 units of PRBCs.   She will be given IV iron sucrose infusion because of severe iron deficiency anemia. Consulted Dr. Tobi Bastos, gastroenterologist to assist with management given deficiency anemia and history of liver Schneider.  Plan for EGD and colonoscopy tomorrow.   Latest Reference Range & Units Most Recent  Iron 28 - 170 ug/dL 12 (L) 08/08/90 47:82  UIBC ug/dL 956 02/10/07 65:78  TIBC 250 - 450 ug/dL 469 (H) 06/27/51 84:13  Saturation Ratios 10.4 - 31.8 % 3 (L) 04/11/23 20:23  Ferritin 11 - 307 ng/mL 4 (L) 04/11/23 20:23  Folate >5.9 ng/mL 25.0 04/11/23 20:23    Exertional chest pain and shortness of breath: Likely due to symptomatic anemia.  Troponins negative.     S/p mechanical fall: Patient ambulated with assistance with mobility specialist.   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  Follow-up with GI   Other comorbidities include type II DM, hypertension, dementia    Diet Order             Diet heart healthy/carb modified Room service appropriate? Yes; Fluid consistency: Thin  Diet effective now                            Consultants: Gastroenterologist  Procedures: None    Medications:    atorvastatin  20 mg Oral Daily   donepezil  5 mg Oral Daily   insulin aspart  0-5 Units Subcutaneous QHS   insulin aspart  0-9 Units Subcutaneous TID WC   lisinopril  10 mg Oral Daily   loratadine  10 mg Oral Daily   Continuous Infusions:   Anti-infectives (From admission, onward)    None              Family Communication/Anticipated D/C date and plan/Code Status   DVT prophylaxis: SCDs Start: 04/11/23 2101     Code Status: Full Code  Family Communication: Plan discussed with [name], daughter-in-law, at the bedside Disposition Plan: Plan to discharge home in 1 to 2 days   Status is: Observation The patient will require care spanning > 2 midnights and should be moved to inpatient because: Needs endoscopic workup for anemia       Subjective:   Interval events noted.  No chest pain, shortness of breath, rectal  bleeding, vomiting or abdominal pain.  She feels better.  [name], daughter-in-law, was at the bedside  Objective:    Vitals:   04/12/23 0044 04/12/23 0247 04/12/23 0559 04/12/23 0857  BP: (!) 147/66 (!) 150/61 (!) 153/60 133/68  Pulse: (!) 59 60 60 61  Resp: 16 16 20 16   Temp: 99 F (37.2 C) 99.2 F (37.3 C) 99.3 F (37.4 C) 99.1 F (37.3 C)  TempSrc: Oral Oral Oral   SpO2: 100% 100% 98% 99%  Weight:      Height:       No data found.   Intake/Output Summary (Last 24 hours) at 04/12/2023 1023 Last data filed at 04/12/2023 0247 Gross per 24 hour  Intake 838.83 ml  Output --  Net 838.83 ml   Filed Weights   04/11/23 1715  Weight: 52.2 kg    Exam:  GEN: NAD SKIN: Warm and  dry EYES: No pallor or icterus ENT: MMM CV: RRR PULM: CTA B ABD: soft, ND, NT, +BS CNS: AAO x 3, non focal EXT: No edema or tenderness        Data Reviewed:   I have personally reviewed following labs and imaging studies:  Labs: Labs show the following:   Basic Metabolic Panel: Recent Labs  Lab 04/11/23 1717  NA 139  K 4.1  CL 110  CO2 20*  GLUCOSE 150*  BUN 14  CREATININE 0.61  CALCIUM 8.5*   GFR Estimated Creatinine Clearance: 47.3 mL/min (by C-G formula based on SCr of 0.61 mg/dL). Liver Function Tests: Recent Labs  Lab 04/11/23 1717  AST 51*  ALT 30  ALKPHOS 103  BILITOT 0.5  PROT 6.8  ALBUMIN 3.6   Recent Labs  Lab 04/11/23 1717  LIPASE 43   No results for input(s): "AMMONIA" in the last 168 hours. Coagulation profile Recent Labs  Lab 04/11/23 1720  INR 1.3*    CBC: Recent Labs  Lab 04/11/23 1717 04/12/23 0446  WBC 6.9  --   HGB 6.1* 9.7*  HCT 22.0*  --   MCV 70.7*  --   PLT 189  --    Cardiac Enzymes: No results for input(s): "CKTOTAL", "CKMB", "CKMBINDEX", "TROPONINI" in the last 168 hours. BNP (last 3 results) No results for input(s): "PROBNP" in the last 8760 hours. CBG: Recent Labs  Lab 04/11/23 2323 04/12/23 0859  GLUCAP 96 96   D-Dimer: No results for input(s): "DDIMER" in the last 72 hours. Hgb A1c: Recent Labs    04/12/23 0446  HGBA1C 6.1*   Lipid Profile: No results for input(s): "CHOL", "HDL", "LDLCALC", "TRIG", "CHOLHDL", "LDLDIRECT" in the last 72 hours. Thyroid function studies: No results for input(s): "TSH", "T4TOTAL", "T3FREE", "THYROIDAB" in the last 72 hours.  Invalid input(s): "FREET3" Anemia work up: Recent Labs    04/11/23 2023 04/12/23 0446  VITAMINB12  --  309  FOLATE 25.0  --   FERRITIN 4*  --   TIBC 476*  --   IRON 12*  --   RETICCTPCT 2.4  --    Sepsis Labs: Recent Labs  Lab 04/11/23 1717  WBC 6.9    Microbiology No results found for this or any previous visit (from the  past 240 hour(s)).  Procedures and diagnostic studies:  CT CHEST ABDOMEN PELVIS W CONTRAST  Result Date: 04/11/2023 CLINICAL DATA:  Polytrauma, blunt.  Fall EXAM: CT CHEST, ABDOMEN, AND PELVIS WITH CONTRAST TECHNIQUE: Multidetector CT imaging of the chest, abdomen and pelvis was performed following the standard protocol during bolus  administration of intravenous contrast. RADIATION DOSE REDUCTION: This exam was performed according to the departmental dose-optimization program which includes automated exposure control, adjustment of the mA and/or kV according to patient size and/or use of iterative reconstruction technique. CONTRAST:  OMNIPAQUE IOHEXOL 300 MG/ML  SOLN COMPARISON:  None Available. FINDINGS: CHEST: Cardiovascular: No aortic injury. The thoracic aorta is normal in caliber. The heart is normal in size. No significant pericardial effusion. Mild atherosclerotic plaque. Mediastinum/Nodes: No pneumomediastinum. No mediastinal hematoma. The esophagus is unremarkable. The thyroid is unremarkable. The central airways are patent. No hilar or axillary lymphadenopathy. Nonspecific prevascular 0.9 cm lymph node (2:26). Lungs/Pleura: No focal consolidation. Couple right lower lobe pulmonary micronodules (4: 83, 91). No pulmonary mass. No pulmonary contusion or laceration. No pneumatocele formation. No pleural effusion. No pneumothorax. No hemothorax. Musculoskeletal/Chest wall: No chest wall mass. No acute rib or sternal fracture. No spinal fracture. Bilateral shoulder degenerative changes. ABDOMEN / PELVIS: Hepatobiliary: Not enlarged. Nodular hepatic contour. No focal lesion. No laceration or subcapsular hematoma. Cholelithiasis. No gallbladder wall thickening or pericholecystic fluid. No biliary ductal dilatation. Pancreas: Normal pancreatic contour. No main pancreatic duct dilatation. Spleen: Not enlarged. No focal lesion. No laceration, subcapsular hematoma, or vascular injury. Adrenals/Urinary  Tract: No nodularity bilaterally. Bilateral kidneys enhance symmetrically. No hydronephrosis. No contusion, laceration, or subcapsular hematoma. Fluid density lesion within the right kidney likely represents a simple renal cyst. Simple renal cysts, in the absence of clinically indicated signs/symptoms, require no independent follow-up. No injury to the vascular structures or collecting systems. No hydroureter. The urinary bladder is unremarkable. Stomach/Bowel: No small or large bowel wall thickening or dilatation. Colonic diverticulosis. The appendix is unremarkable. Vasculature/Lymphatics: Mild atherosclerotic plaque. No abdominal aorta or iliac aneurysm. No active contrast extravasation or pseudoaneurysm. No abdominal, pelvic, inguinal lymphadenopathy. Reproductive: Uterus and bilateral ovaries are unremarkable. No adnexal mass. Other: No simple free fluid ascites. No pneumoperitoneum. No hemoperitoneum. No mesenteric hematoma identified. No organized fluid collection. Musculoskeletal: No significant soft tissue hematoma. No acute pelvic fracture. No spinal fracture. L2-L3 posterior disc osteophyte complex formation. Ports and Devices: None. IMPRESSION: 1. No acute intrathoracic, intra-abdominal, intrapelvic traumatic injury. 2. No acute fracture or traumatic malalignment of the thoracic or lumbar spine. 3. Other imaging findings of potential clinical significance: Question cirrhotic morphology of the liver-recommend nonemergent outpatient right upper quadrant ultrasound. Colonic diverticulosis with no acute diverticulitis. Electronically Signed   By: Tish Frederickson M.D.   On: 04/11/2023 19:42   CT Head Wo Contrast  Result Date: 04/11/2023 CLINICAL DATA:  Head trauma, minor (Age >= 65y); Neck trauma (Age >= 65y) Pt to ED for chest pressure 5/10 since 2 days. Pt fell on Tuesday and since then has been having pain in whole body also. Also is having SOB when climbs stairs at home, 13 steps. When gets to top of  stairs, is out of breath. EXAM: CT HEAD WITHOUT CONTRAST CT CERVICAL SPINE WITHOUT CONTRAST TECHNIQUE: Multidetector CT imaging of the head and cervical spine was performed following the standard protocol without intravenous contrast. Multiplanar CT image reconstructions of the cervical spine were also generated. RADIATION DOSE REDUCTION: This exam was performed according to the departmental dose-optimization program which includes automated exposure control, adjustment of the mA and/or kV according to patient size and/or use of iterative reconstruction technique. COMPARISON:  CT C-spine 10/16/2021 FINDINGS: CT HEAD FINDINGS Brain: No evidence of large-territorial acute infarction. No parenchymal hemorrhage. No mass lesion. No extra-axial collection. No mass effect or midline shift. No hydrocephalus. Basilar cisterns  are patent. Vascular: No hyperdense vessel. Atherosclerotic calcifications are present within the cavernous internal carotid arteries. Skull: No acute fracture or focal lesion. Sinuses/Orbits: Paranasal sinuses and mastoid air cells are clear. The orbits are unremarkable. Other: None. CT CERVICAL SPINE FINDINGS Alignment: Normal. Skull base and vertebrae: Multilevel moderate degenerative changes spine. Moderate left C4-C5 osseous neural foraminal stenosis. No associated severe osseous neural foraminal or central canal stenosis. No acute fracture. No aggressive appearing focal osseous lesion or focal pathologic process. Soft tissues and spinal canal: No prevertebral fluid or swelling. No visible canal hematoma. Upper chest: Unremarkable. Other: None. IMPRESSION: 1. No acute intracranial abnormality. 2. No acute displaced fracture or traumatic listhesis of the cervical spine. Electronically Signed   By: Tish Frederickson M.D.   On: 04/11/2023 19:28   CT Cervical Spine Wo Contrast  Result Date: 04/11/2023 CLINICAL DATA:  Head trauma, minor (Age >= 65y); Neck trauma (Age >= 65y) Pt to ED for chest  pressure 5/10 since 2 days. Pt fell on Tuesday and since then has been having pain in whole body also. Also is having SOB when climbs stairs at home, 13 steps. When gets to top of stairs, is out of breath. EXAM: CT HEAD WITHOUT CONTRAST CT CERVICAL SPINE WITHOUT CONTRAST TECHNIQUE: Multidetector CT imaging of the head and cervical spine was performed following the standard protocol without intravenous contrast. Multiplanar CT image reconstructions of the cervical spine were also generated. RADIATION DOSE REDUCTION: This exam was performed according to the departmental dose-optimization program which includes automated exposure control, adjustment of the mA and/or kV according to patient size and/or use of iterative reconstruction technique. COMPARISON:  CT C-spine 10/16/2021 FINDINGS: CT HEAD FINDINGS Brain: No evidence of large-territorial acute infarction. No parenchymal hemorrhage. No mass lesion. No extra-axial collection. No mass effect or midline shift. No hydrocephalus. Basilar cisterns are patent. Vascular: No hyperdense vessel. Atherosclerotic calcifications are present within the cavernous internal carotid arteries. Skull: No acute fracture or focal lesion. Sinuses/Orbits: Paranasal sinuses and mastoid air cells are clear. The orbits are unremarkable. Other: None. CT CERVICAL SPINE FINDINGS Alignment: Normal. Skull base and vertebrae: Multilevel moderate degenerative changes spine. Moderate left C4-C5 osseous neural foraminal stenosis. No associated severe osseous neural foraminal or central canal stenosis. No acute fracture. No aggressive appearing focal osseous lesion or focal pathologic process. Soft tissues and spinal canal: No prevertebral fluid or swelling. No visible canal hematoma. Upper chest: Unremarkable. Other: None. IMPRESSION: 1. No acute intracranial abnormality. 2. No acute displaced fracture or traumatic listhesis of the cervical spine. Electronically Signed   By: Tish Frederickson M.D.    On: 04/11/2023 19:28   DG Chest 2 View  Result Date: 04/11/2023 CLINICAL DATA:  Chest pain and dyspnea with exertion. EXAM: CHEST - 2 VIEW COMPARISON:  Chest x-ray dated 02/12/2016 FINDINGS: Heart size and mediastinal contours are stable. Lungs are clear. No pleural effusion or pneumothorax is seen. Osseous structures about the chest are unremarkable. IMPRESSION: No active cardiopulmonary disease. No evidence of pneumonia or pulmonary edema. Electronically Signed   By: Bary Richard M.D.   On: 04/11/2023 17:32               LOS: 0 days   Thirza Pellicano  [facility]   Pager on [email or web address]. If 7PM-7AM, please contact night-coverage at [email or web address]     04/12/2023, 10:23 AM

## 2023-04-12 NOTE — Consult Note (Addendum)
[name] , MD [address], [place], [place], [address] [address], [address], [place], [place], [address] Phone: [phone]  Fax: [phone]  Consultation  Referring Provider:     Dr Myriam Forehand  Primary Care Physician:  Gavin Potters, MD Primary Gastroenterologist:  Dr. Servando Snare         Reason for Consultation:     Anemia   Date of Admission:  04/11/2023 Date of Consultation:  04/12/2023         HPI:   Holly Schneider is a 77 y.o. female with a history of NASH ,dementia brought into the ER with shortness of breath and chest pain .  Found to have a Hb of 6.1 ,MCV 70 and ferritin 4 , b12 300 .Platelet count 189,albumin 3.6   CT chest abdomen and pelvis showed features of liver cirrhosis .   Last EGD by Dr Servando Snare in 2023 showed no gross abnormalities. Admitted and trasnfused 1 unit PRBC.  She has a diagnosis of liver cirrhosis but has not had follow up since has last visit with Dr Servando Snare .   No overt blood loss reported. Speaks only spanish . Denies any nose bleeds, blood per rectum , in urine or vaginal bleeding . Denies any NSAID use. Recollects she has been evaluated some years back but unsure. No abdominal pains or any other GI complaints.     Past Medical History:  Diagnosis Date   Arthritis    Benign essential hypertension 12/07/2015   Cirrhosis of liver without ascites 10/18/2018   Dementia    Diabetes    Dysphagia    HLD (hyperlipidemia)    HTN (hypertension)    Incomplete defecation    Mixed hyperlipidemia 11/07/2019   Moderate mitral insufficiency 02/28/2015   Urinary incontinence    Vaginal atrophy 06/03/2016   Wears dentures    partial upper and lower    Past Surgical History:  Procedure Laterality Date   COLONOSCOPY WITH PROPOFOL N/A 11/28/2019   Procedure: COLONOSCOPY WITH PROPOFOL;  Surgeon: Midge Minium, MD;  Location: [facility];  Service: Endoscopy;  Laterality: N/A;  Dioabetes - insulin   ESOPHAGOGASTRODUODENOSCOPY (EGD) WITH  PROPOFOL N/A 07/29/2022   Procedure: ESOPHAGOGASTRODUODENOSCOPY (EGD) WITH PROPOFOL;  Surgeon: Midge Minium, MD;  Location: [facility];  Service: Endoscopy;  Laterality: N/A;   TUBAL LIGATION      Prior to Admission medications   Medication Sig Start Date End Date Taking? Authorizing Provider  atorvastatin (LIPITOR) 40 MG tablet  05/19/16   [provider]  Calcium Carb-Cholecalciferol (CALCIUM HIGH POTENCY/VITAMIN D) 600-5 MG-MCG TABS Take by mouth.    [provider]  celecoxib (CELEBREX) 100 MG capsule Take 100 mg by mouth 2 (two) times daily. 06/02/22   [provider]  cetirizine (ZYRTEC) 10 MG tablet Take 10 mg by mouth daily. 07/31/22   [provider]  donepezil (ARICEPT) 5 MG tablet Take 5 mg by mouth daily. 04/24/22   [provider]  ibuprofen (ADVIL) 400 MG tablet Take 400 mg by mouth every 6 (six) hours as needed. 08/01/19   [provider]  ketoconazole (NIZORAL) 2 % cream Apply twice daily to hands and feet/web spaces. 09/30/22   Willeen Niece, MD  LEVEMIR FLEXTOUCH 100 UNIT/ML Pen  09/06/19   [provider]  lisinopril (ZESTRIL) 10 MG tablet Take 10 mg by mouth daily. 07/24/22   [provider]  Multiple Vitamins-Minerals (PRESERVISION AREDS 2 PO) Take by mouth daily.  [provider]  terbinafine (LAMISIL) 250 MG tablet Take 1 tablet (250 mg total) by mouth daily. Take with food. 10/01/22   Willeen Niece, MD    Family History  Problem Relation Age of Onset   Kidney disease Neg Hx    Breast cancer Neg Hx      Social History   Tobacco Use   Smoking status: Never   Smokeless tobacco: Never  Vaping Use   Vaping Use: Never used  Substance Use Topics   Alcohol use: No    Alcohol/week: 0.0 standard drinks of alcohol   Drug use: No    Allergies as of 04/11/2023 - Review Complete 04/11/2023  Allergen Reaction Noted   Glucophage [metformin] Diarrhea 11/21/2019   Mobic [meloxicam]  11/21/2019     Review of Systems:    All systems reviewed and negative except where noted in HPI.   Physical Exam:  Vital signs in last 24 hours: Temp:  [98.1 F (36.7 C)-99.3 F (37.4 C)] 99.1 F (37.3 C) (04/14 0857) Pulse Rate:  [59-70] 61 (04/14 0857) Resp:  [13-20] 16 (04/14 0857) BP: (125-180)/(56-68) 133/68 (04/14 0857) SpO2:  [96 %-100 %] 99 % (04/14 0857) Weight:  [52.2 kg] 52.2 kg (04/13 1715) Last BM Date : 04/11/23 General:   Pleasant, cooperative in NAD Head:  Normocephalic and atraumatic. Eyes:   No icterus.   Conjunctiva pink. PERRLA. Ears:  Normal auditory acuity. Neck:  Supple; no masses or thyroidomegaly Lungs: Respirations even and unlabored. Lungs clear to auscultation bilaterally.   No wheezes, crackles, or rhonchi.  Heart:  Regular rate and rhythm;  Without murmur, clicks, rubs or gallops Abdomen:  Soft, nondistended, nontender. Normal bowel sounds. No appreciable masses or hepatomegaly.  No rebound or guarding.  Neurologic:  Alert and oriented x3;  grossly normal neurologically. Skin:  Intact without significant lesions or rashes. Cervical Nodes:  No significant cervical adenopathy. Psych:  Alert and cooperative. Normal affect.  LAB RESULTS: Recent Labs    04/11/23 1717 04/12/23 0446  WBC 6.9  --   HGB 6.1* 9.7*  HCT 22.0*  --   PLT 189  --    BMET Recent Labs    04/11/23 1717  NA 139  K 4.1  CL 110  CO2 20*  GLUCOSE 150*  BUN 14  CREATININE 0.61  CALCIUM 8.5*   LFT Recent Labs    04/11/23 1717  PROT 6.8  ALBUMIN 3.6  AST 51*  ALT 30  ALKPHOS 103  BILITOT 0.5  BILIDIR <0.1  IBILI NOT CALCULATED   PT/INR Recent Labs    04/11/23 1720  LABPROT 15.7*  INR 1.3*    STUDIES: CT CHEST ABDOMEN PELVIS W CONTRAST  Result Date: 04/11/2023 CLINICAL DATA:  Polytrauma, blunt.  Fall EXAM: CT CHEST, ABDOMEN, AND PELVIS WITH CONTRAST TECHNIQUE: Multidetector CT imaging of the chest, abdomen and pelvis was performed following the standard protocol  during bolus administration of intravenous contrast. RADIATION DOSE REDUCTION: This exam was performed according to the departmental dose-optimization program which includes automated exposure control, adjustment of the mA and/or kV according to patient size and/or use of iterative reconstruction technique. CONTRAST:  OMNIPAQUE IOHEXOL 300 MG/ML  SOLN COMPARISON:  None Available. FINDINGS: CHEST: Cardiovascular: No aortic injury. The thoracic aorta is normal in caliber. The heart is normal in size. No significant pericardial effusion. Mild atherosclerotic plaque. Mediastinum/Nodes: No pneumomediastinum. No mediastinal hematoma. The esophagus is unremarkable. The thyroid is unremarkable. The central airways are patent. No hilar or axillary  lymphadenopathy. Nonspecific prevascular 0.9 cm lymph node (2:26). Lungs/Pleura: No focal consolidation. Couple right lower lobe pulmonary micronodules (4: 83, 91). No pulmonary mass. No pulmonary contusion or laceration. No pneumatocele formation. No pleural effusion. No pneumothorax. No hemothorax. Musculoskeletal/Chest wall: No chest wall mass. No acute rib or sternal fracture. No spinal fracture. Bilateral shoulder degenerative changes. ABDOMEN / PELVIS: Hepatobiliary: Not enlarged. Nodular hepatic contour. No focal lesion. No laceration or subcapsular hematoma. Cholelithiasis. No gallbladder wall thickening or pericholecystic fluid. No biliary ductal dilatation. Pancreas: Normal pancreatic contour. No main pancreatic duct dilatation. Spleen: Not enlarged. No focal lesion. No laceration, subcapsular hematoma, or vascular injury. Adrenals/Urinary Tract: No nodularity bilaterally. Bilateral kidneys enhance symmetrically. No hydronephrosis. No contusion, laceration, or subcapsular hematoma. Fluid density lesion within the right kidney likely represents a simple renal cyst. Simple renal cysts, in the absence of clinically indicated signs/symptoms, require no independent  follow-up. No injury to the vascular structures or collecting systems. No hydroureter. The urinary bladder is unremarkable. Stomach/Bowel: No small or large bowel wall thickening or dilatation. Colonic diverticulosis. The appendix is unremarkable. Vasculature/Lymphatics: Mild atherosclerotic plaque. No abdominal aorta or iliac aneurysm. No active contrast extravasation or pseudoaneurysm. No abdominal, pelvic, inguinal lymphadenopathy. Reproductive: Uterus and bilateral ovaries are unremarkable. No adnexal mass. Other: No simple free fluid ascites. No pneumoperitoneum. No hemoperitoneum. No mesenteric hematoma identified. No organized fluid collection. Musculoskeletal: No significant soft tissue hematoma. No acute pelvic fracture. No spinal fracture. L2-L3 posterior disc osteophyte complex formation. Ports and Devices: None. IMPRESSION: 1. No acute intrathoracic, intra-abdominal, intrapelvic traumatic injury. 2. No acute fracture or traumatic malalignment of the thoracic or lumbar spine. 3. Other imaging findings of potential clinical significance: Question cirrhotic morphology of the liver-recommend nonemergent outpatient right upper quadrant ultrasound. Colonic diverticulosis with no acute diverticulitis. Electronically Signed   By: Tish Frederickson M.D.   On: 04/11/2023 19:42   CT Head Wo Contrast  Result Date: 04/11/2023 CLINICAL DATA:  Head trauma, minor (Age >= 65y); Neck trauma (Age >= 65y) Pt to ED for chest pressure 5/10 since 2 days. Pt fell on Tuesday and since then has been having pain in whole body also. Also is having SOB when climbs stairs at home, 13 steps. When gets to top of stairs, is out of breath. EXAM: CT HEAD WITHOUT CONTRAST CT CERVICAL SPINE WITHOUT CONTRAST TECHNIQUE: Multidetector CT imaging of the head and cervical spine was performed following the standard protocol without intravenous contrast. Multiplanar CT image reconstructions of the cervical spine were also generated. RADIATION  DOSE REDUCTION: This exam was performed according to the departmental dose-optimization program which includes automated exposure control, adjustment of the mA and/or kV according to patient size and/or use of iterative reconstruction technique. COMPARISON:  CT C-spine 10/16/2021 FINDINGS: CT HEAD FINDINGS Brain: No evidence of large-territorial acute infarction. No parenchymal hemorrhage. No mass lesion. No extra-axial collection. No mass effect or midline shift. No hydrocephalus. Basilar cisterns are patent. Vascular: No hyperdense vessel. Atherosclerotic calcifications are present within the cavernous internal carotid arteries. Skull: No acute fracture or focal lesion. Sinuses/Orbits: Paranasal sinuses and mastoid air cells are clear. The orbits are unremarkable. Other: None. CT CERVICAL SPINE FINDINGS Alignment: Normal. Skull base and vertebrae: Multilevel moderate degenerative changes spine. Moderate left C4-C5 osseous neural foraminal stenosis. No associated severe osseous neural foraminal or central canal stenosis. No acute fracture. No aggressive appearing focal osseous lesion or focal pathologic process. Soft tissues and spinal canal: No prevertebral fluid or swelling. No visible canal hematoma. Upper chest: Unremarkable.  Other: None. IMPRESSION: 1. No acute intracranial abnormality. 2. No acute displaced fracture or traumatic listhesis of the cervical spine. Electronically Signed   By: Tish Frederickson M.D.   On: 04/11/2023 19:28   CT Cervical Spine Wo Contrast  Result Date: 04/11/2023 CLINICAL DATA:  Head trauma, minor (Age >= 65y); Neck trauma (Age >= 65y) Pt to ED for chest pressure 5/10 since 2 days. Pt fell on Tuesday and since then has been having pain in whole body also. Also is having SOB when climbs stairs at home, 13 steps. When gets to top of stairs, is out of breath. EXAM: CT HEAD WITHOUT CONTRAST CT CERVICAL SPINE WITHOUT CONTRAST TECHNIQUE: Multidetector CT imaging of the head and cervical  spine was performed following the standard protocol without intravenous contrast. Multiplanar CT image reconstructions of the cervical spine were also generated. RADIATION DOSE REDUCTION: This exam was performed according to the departmental dose-optimization program which includes automated exposure control, adjustment of the mA and/or kV according to patient size and/or use of iterative reconstruction technique. COMPARISON:  CT C-spine 10/16/2021 FINDINGS: CT HEAD FINDINGS Brain: No evidence of large-territorial acute infarction. No parenchymal hemorrhage. No mass lesion. No extra-axial collection. No mass effect or midline shift. No hydrocephalus. Basilar cisterns are patent. Vascular: No hyperdense vessel. Atherosclerotic calcifications are present within the cavernous internal carotid arteries. Skull: No acute fracture or focal lesion. Sinuses/Orbits: Paranasal sinuses and mastoid air cells are clear. The orbits are unremarkable. Other: None. CT CERVICAL SPINE FINDINGS Alignment: Normal. Skull base and vertebrae: Multilevel moderate degenerative changes spine. Moderate left C4-C5 osseous neural foraminal stenosis. No associated severe osseous neural foraminal or central canal stenosis. No acute fracture. No aggressive appearing focal osseous lesion or focal pathologic process. Soft tissues and spinal canal: No prevertebral fluid or swelling. No visible canal hematoma. Upper chest: Unremarkable. Other: None. IMPRESSION: 1. No acute intracranial abnormality. 2. No acute displaced fracture or traumatic listhesis of the cervical spine. Electronically Signed   By: Tish Frederickson M.D.   On: 04/11/2023 19:28   DG Chest 2 View  Result Date: 04/11/2023 CLINICAL DATA:  Chest pain and dyspnea with exertion. EXAM: CHEST - 2 VIEW COMPARISON:  Chest x-ray dated 02/12/2016 FINDINGS: Heart size and mediastinal contours are stable. Lungs are clear. No pleural effusion or pneumothorax is seen. Osseous structures about the  chest are unremarkable. IMPRESSION: No active cardiopulmonary disease. No evidence of pneumonia or pulmonary edema. Electronically Signed   By: Bary Richard M.D.   On: 04/11/2023 17:32      Impression / Plan:   Holly Schneider is a 77 y.o. 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 Admitted with shortness of breath and found to have new onset severe iron deficiency anemia with no overt blood loss.   Plan  1 . Monitor CBC and transfuse as needed  2. IV iron  3. Celiac serology , urine analysis , EGD+colonoscopy tomorrow with Dr Servando Snare for evaluation of anemia. If negative suggest capsule study tomorrow .  4. Celecoxib on her MAR: she should dc it and not take in future as it can cause anemia.  5. Require follow up with Dr Servando Snare at discharge for liver cirrhosis.   I have discussed alternative options, risks & benefits,  which include, but are not limited to, bleeding, infection, perforation,respiratory complication & drug reaction.  The patient agrees with this plan & written consent will be obtained.      Thank you for involving me in the care  of this patient.      LOS: 0 days   [name], MD  04/12/2023, 10:41 AM

## 2023-04-12 NOTE — Progress Notes (Signed)
Mobility Specialist - Progress Note   04/12/23 0828  Mobility  Activity Ambulated with assistance to bathroom;Stood at bedside;Dangled on edge of bed  Level of Assistance Standby assist, set-up cues, supervision of patient - no hands on  Assistive Device None  Distance Ambulated (ft) 15 ft  Activity Response Tolerated well  Mobility Referral Yes  $Mobility charge 1 Mobility   Pt supine in bed on RA upon arrival. Pt completes bed mobility HHA and STS and ambulates to/from bathroom SBA with no LOB noted.  Pt returns to bed with needs in reach and bed alarm set.   Terrilyn Saver  Mobility Specialist  04/12/23 8:31 AM

## 2023-04-13 ENCOUNTER — Inpatient Hospital Stay: Payer: Medicare Other | Admitting: Certified Registered Nurse Anesthetist

## 2023-04-13 ENCOUNTER — Encounter: Payer: Self-pay | Admitting: Internal Medicine

## 2023-04-13 ENCOUNTER — Encounter
Admission: EM | Disposition: A | Discharge status: Home / Self Care | Payer: Self-pay | Source: Home / Self Care | Attending: Internal Medicine

## 2023-04-13 DIAGNOSIS — D649 Anemia, unspecified: Secondary | ICD-10-CM | POA: Diagnosis not present

## 2023-04-13 DIAGNOSIS — D500 Iron deficiency anemia secondary to blood loss (chronic): Secondary | ICD-10-CM

## 2023-04-13 DIAGNOSIS — D509 Iron deficiency anemia, unspecified: Principal | ICD-10-CM

## 2023-04-13 HISTORY — PX: ESOPHAGOGASTRODUODENOSCOPY (EGD) WITH PROPOFOL: SHX5813

## 2023-04-13 HISTORY — PX: COLONOSCOPY WITH PROPOFOL: SHX5780

## 2023-04-13 HISTORY — PX: GIVENS CAPSULE STUDY: SHX5432

## 2023-04-13 LAB — CBC WITH DIFFERENTIAL/PLATELET
Abs Immature Granulocytes: 0.03 10*3/uL (ref 0.00–0.07)
Basophils Absolute: 0.1 10*3/uL (ref 0.0–0.1)
Basophils Relative: 1 %
Eosinophils Absolute: 0.2 10*3/uL (ref 0.0–0.5)
Eosinophils Relative: 3 %
HCT: 35.5 % — ABNORMAL LOW (ref 36.0–46.0)
Hemoglobin: 10.6 g/dL — ABNORMAL LOW (ref 12.0–15.0)
Immature Granulocytes: 0 %
Lymphocytes Relative: 25 %
Lymphs Abs: 1.8 10*3/uL (ref 0.7–4.0)
MCH: 22.2 pg — ABNORMAL LOW (ref 26.0–34.0)
MCHC: 29.9 g/dL — ABNORMAL LOW (ref 30.0–36.0)
MCV: 74.3 fL — ABNORMAL LOW (ref 80.0–100.0)
Monocytes Absolute: 0.7 10*3/uL (ref 0.1–1.0)
Monocytes Relative: 9 %
Neutro Abs: 4.4 10*3/uL (ref 1.7–7.7)
Neutrophils Relative %: 62 %
Platelets: 168 10*3/uL (ref 150–400)
RBC: 4.78 MIL/uL (ref 3.87–5.11)
RDW: 21.7 % — ABNORMAL HIGH (ref 11.5–15.5)
WBC: 7.2 10*3/uL (ref 4.0–10.5)
nRBC: 0.7 % — ABNORMAL HIGH (ref 0.0–0.2)

## 2023-04-13 LAB — BASIC METABOLIC PANEL
Anion gap: 8 (ref 5–15)
BUN: 12 mg/dL (ref 8–23)
CO2: 22 mmol/L (ref 22–32)
Calcium: 8.5 mg/dL — ABNORMAL LOW (ref 8.9–10.3)
Chloride: 108 mmol/L (ref 98–111)
Creatinine, Ser: 0.5 mg/dL (ref 0.44–1.00)
GFR, Estimated: >60 mL/min (ref >60)
Glucose, Bld: 64 mg/dL — ABNORMAL LOW (ref 70–99)
Potassium: 3.8 mmol/L (ref 3.5–5.1)
Sodium: 138 mmol/L (ref 135–145)

## 2023-04-13 LAB — BPAM RBC
Blood Product Expiration Date: 202405202359
ISSUE DATE / TIME: 202404132117
ISSUE DATE / TIME: 202404140018

## 2023-04-13 LAB — URINALYSIS, COMPLETE (UACMP) WITH MICROSCOPIC
Bilirubin Urine: NEGATIVE
Glucose, UA: 50 mg/dL — AB
Hgb urine dipstick: NEGATIVE
Ketones, ur: 5 mg/dL — AB
Leukocytes,Ua: NEGATIVE
Nitrite: NEGATIVE
Protein, ur: NEGATIVE mg/dL
Specific Gravity, Urine: 1.011 (ref 1.005–1.030)
pH: 6 (ref 5.0–8.0)

## 2023-04-13 LAB — TYPE AND SCREEN: Unit division: 0

## 2023-04-13 LAB — GLUCOSE, CAPILLARY
Glucose-Capillary: 73 mg/dL (ref 70–99)
Glucose-Capillary: 65 mg/dL — ABNORMAL LOW (ref 70–99)
Glucose-Capillary: 149 mg/dL — ABNORMAL HIGH (ref 70–99)
Glucose-Capillary: 60 mg/dL — ABNORMAL LOW (ref 70–99)
Glucose-Capillary: 118 mg/dL — ABNORMAL HIGH (ref 70–99)
Glucose-Capillary: 76 mg/dL (ref 70–99)

## 2023-04-13 SURGERY — ESOPHAGOGASTRODUODENOSCOPY (EGD) WITH PROPOFOL
Anesthesia: General

## 2023-04-13 MED ORDER — DEXTROSE 50 % IV SOLN
12.5000 g | Freq: Once | INTRAVENOUS | Status: AC
  Administered 2023-04-13: 12.5 g via INTRAVENOUS

## 2023-04-13 MED ORDER — DEXTROSE 50 % IV SOLN
12.5000 g | Freq: Once | INTRAVENOUS | Status: AC
  Administered 2023-04-13: 12.5 g via INTRAVENOUS
  Filled 2023-04-13: qty 50

## 2023-04-13 MED ORDER — STERILE WATER FOR IRRIGATION IR SOLN
Status: DC | PRN
  Administered 2023-04-13: 60 mL

## 2023-04-13 MED ORDER — PROPOFOL 500 MG/50ML IV EMUL
INTRAVENOUS | Status: DC | PRN
  Administered 2023-04-13: 120 ug/kg/min via INTRAVENOUS

## 2023-04-13 MED ORDER — LIDOCAINE HCL (CARDIAC) PF 100 MG/5ML IV SOSY
PREFILLED_SYRINGE | INTRAVENOUS | Status: DC | PRN
  Administered 2023-04-13: 50 mg via INTRAVENOUS

## 2023-04-13 MED ORDER — DEXTROSE 50 % IV SOLN
INTRAVENOUS | Status: AC
  Filled 2023-04-13: qty 50

## 2023-04-13 MED ORDER — DEXTROSE-NACL 5-0.9 % IV SOLN: INTRAVENOUS | Status: AC

## 2023-04-13 MED ORDER — PROPOFOL 10 MG/ML IV BOLUS
INTRAVENOUS | Status: DC | PRN
  Administered 2023-04-13: 20 mg via INTRAVENOUS
  Administered 2023-04-13: 30 mg via INTRAVENOUS

## 2023-04-13 MED ORDER — SODIUM CHLORIDE 0.9 % IV SOLN
200.0000 mg | Freq: Once | INTRAVENOUS | Status: AC
  Administered 2023-04-13: 200 mg via INTRAVENOUS
  Filled 2023-04-13: qty 200

## 2023-04-13 NOTE — Anesthesia Postprocedure Evaluation (Signed)
Anesthesia Post Note  Patient: Holly Schneider  Procedure(s) Performed: ESOPHAGOGASTRODUODENOSCOPY (EGD) WITH PROPOFOL COLONOSCOPY WITH PROPOFOL  Patient location during evaluation: PACU Anesthesia Type: General Level of consciousness: awake and alert Pain management: pain level controlled Vital Signs Assessment: post-procedure vital signs reviewed and stable Respiratory status: spontaneous breathing, nonlabored ventilation, respiratory function stable and patient connected to nasal cannula oxygen Cardiovascular status: blood pressure returned to baseline and stable Postop Assessment: no apparent nausea or vomiting Anesthetic complications: no   No notable events documented.   Last Vitals:  Vitals:   04/13/23 1527 04/13/23 1537  BP: 118/63 130/67  Pulse: 61 (!) 57  Resp: 20 17  Temp:    SpO2: 96% 96%    Last Pain:  Vitals:   04/13/23 1537  TempSrc:   PainSc: 0-No pain                 Yevette Edwards

## 2023-04-13 NOTE — Progress Notes (Addendum)
Progress Note    [name]  ZOX:[id number] DOB: 1946-08-21  DOA: 04/11/2023 PCP: Gavin Potters, MD      Brief Narrative:    Medical records reviewed and are as summarized below:  Holly Schneider is a 77 y.o. female with medical history significant for diabetes mellitus, hypertension, Elita Boone cirrhosis, dementia, history of fall in 2022, who presented to the hospital because of shortness of breath with exertion, general weakness, exertional chest pain and mechanical fall.  Reportedly, patient tripped over her walker and fell.  Hemoglobin in the ED emergency department was 6.1.  She was admitted to the hospital for severe anemia.  She was transfused with 2 units of packed red blood cells.        Assessment/Plan:   Principal Problem:   Symptomatic anemia Active Problems:   Chest pain   Benign essential hypertension   Liver cirrhosis secondary to NASH   Dementia   Personal history of fall   Diabetes    Body mass index is 21.03 kg/m.   Symptomatic, severe anemia; iron deficiency anemia: Suspected blood loss anemia.  Hemoglobin is stable at 10.6.  S/p transfusion with 2 units of PRBCs.   S/p infusion with IV iron sucrose on 04/12/2023.  Give another dose of IV iron sucrose infusion today. S/p EGD which was unremarkable.  S/p colonoscopy which showed diverticulosis.  No evidence of bleeding was found on endoscopy. Patient was given a capsule this afternoon for capsule endoscopy.  Follow-up with gastroenterologist.     Latest Reference Range & Units Most Recent  Iron 28 - 170 ug/dL 12 (L) 08/08/90 47:82  UIBC ug/dL 956 02/10/07 65:78  TIBC 250 - 450 ug/dL 469 (H) 06/27/51 84:13  Saturation Ratios 10.4 - 31.8 % 3 (L) 04/11/23 20:23  Ferritin 11 - 307 ng/mL 4 (L) 04/11/23 20:23  Folate >5.9 ng/mL 25.0 04/11/23 20:23    Exertional chest pain and shortness of breath: Likely due to symptomatic anemia.  Resolved.  Troponins negative.     S/p mechanical fall:  Patient ambulated with assistance with mobility specialist.   Liver cirrhosis secondary to NASH: Compensated.  Follow-up with GI   Hypoglycemia because of n.p.o. status: Glucose dropped to 60.  Start IV fluids for hydration for a few hours until patient can have a diet this evening.   Other comorbidities include type II DM, hypertension, dementia   Plan discussed with the daughter-in-law at the bedside.    Diet Order             Diet NPO time specified Except for: Sips with Meds  Diet effective midnight           Diet NPO time specified Except for: Sips with Meds  Diet effective now                            Consultants: Gastroenterologist  Procedures: None    Medications:    [MAR Hold] atorvastatin  20 mg Oral Daily   [MAR Hold] donepezil  5 mg Oral Daily   [MAR Hold] insulin aspart  0-5 Units Subcutaneous QHS   [MAR Hold] insulin aspart  0-9 Units Subcutaneous TID WC   [MAR Hold] loratadine  10 mg Oral Daily   Continuous Infusions:  sodium chloride Stopped (04/13/23 1518)     Anti-infectives (From admission, onward)    None              Family Communication/Anticipated  D/C date and plan/Code Status   DVT prophylaxis: SCDs Start: 04/11/23 2101     Code Status: Full Code  Family Communication: Plan discussed with [name], daughter-in-law, at the bedside Disposition Plan: Plan to discharge home tomorrow   Status is: Inpatient Remains inpatient appropriate because: Workup for anemia in progress        Subjective:   Interval events noted.  No abdominal pain, rectal bleeding or hematemesis.  She complains of hunger because of n.p.o. status.  Objective:    Vitals:   04/13/23 0745 04/13/23 1407 04/13/23 1517 04/13/23 1527  BP: 127/76 (!) 154/67 (!) 100/58   Pulse: 64 (!) 58 65 61  Resp: 17 14 16 20   Temp:  (!) 96.8 F (36 C) (!) 96.6 F (35.9 C)   TempSrc:  Tympanic Temporal   SpO2: 97% 94% 96% 96%  Weight:       Height:       No data found.   Intake/Output Summary (Last 24 hours) at 04/13/2023 1533 Last data filed at 04/13/2023 1518 Gross per 24 hour  Intake 500 ml  Output --  Net 500 ml   Filed Weights   04/11/23 1715  Weight: 52.2 kg    Exam:  GEN: NAD SKIN: Warm and dry EYES: No pallor or icterus ENT: MMM CV: RRR PULM: CTA B ABD: soft, ND, NT, +BS CNS: AAO x 3, non focal EXT: No edema or tenderness        Data Reviewed:   I have personally reviewed following labs and imaging studies:  Labs: Labs show the following:   Basic Metabolic Panel: Recent Labs  Lab 04/11/23 1717 04/13/23 1139  NA 139 138  K 4.1 3.8  CL 110 108  CO2 20* 22  GLUCOSE 150* 64*  BUN 14 12  CREATININE 0.61 0.50  CALCIUM 8.5* 8.5*   GFR Estimated Creatinine Clearance: 47.3 mL/min (by C-G formula based on SCr of 0.5 mg/dL). Liver Function Tests: Recent Labs  Lab 04/11/23 1717  AST 51*  ALT 30  ALKPHOS 103  BILITOT 0.5  PROT 6.8  ALBUMIN 3.6   Recent Labs  Lab 04/11/23 1717  LIPASE 43   No results for input(s): "AMMONIA" in the last 168 hours. Coagulation profile Recent Labs  Lab 04/11/23 1720  INR 1.3*    CBC: Recent Labs  Lab 04/11/23 1717 04/12/23 0446 04/12/23 1026 04/13/23 0309  WBC 6.9  --   --  7.2  NEUTROABS  --   --   --  4.4  HGB 6.1* 9.7* 10.2* 10.6*  HCT 22.0*  --   --  35.5*  MCV 70.7*  --   --  74.3*  PLT 189  --   --  168   Cardiac Enzymes: No results for input(s): "CKTOTAL", "CKMB", "CKMBINDEX", "TROPONINI" in the last 168 hours. BNP (last 3 results) No results for input(s): "PROBNP" in the last 8760 hours. CBG: Recent Labs  Lab 04/12/23 1721 04/12/23 2003 04/13/23 0746 04/13/23 1247 04/13/23 1342  GLUCAP 81 95 73 65* 149*   D-Dimer: No results for input(s): "DDIMER" in the last 72 hours. Hgb A1c: Recent Labs    04/12/23 0446  HGBA1C 6.1*   Lipid Profile: No results for input(s): "CHOL", "HDL", "LDLCALC", "TRIG", "CHOLHDL",  "LDLDIRECT" in the last 72 hours. Thyroid function studies: No results for input(s): "TSH", "T4TOTAL", "T3FREE", "THYROIDAB" in the last 72 hours.  Invalid input(s): "FREET3" Anemia work up: Recent Labs    04/11/23 2023 04/12/23 0446  VITAMINB12  --  309  FOLATE 25.0  --   FERRITIN 4*  --   TIBC 476*  --   IRON 12*  --   RETICCTPCT 2.4  --    Sepsis Labs: Recent Labs  Lab 04/11/23 1717 04/13/23 0309  WBC 6.9 7.2    Microbiology No results found for this or any previous visit (from the past 240 hour(s)).  Procedures and diagnostic studies:  CT CHEST ABDOMEN PELVIS W CONTRAST  Result Date: 04/11/2023 CLINICAL DATA:  Polytrauma, blunt.  Fall EXAM: CT CHEST, ABDOMEN, AND PELVIS WITH CONTRAST TECHNIQUE: Multidetector CT imaging of the chest, abdomen and pelvis was performed following the standard protocol during bolus administration of intravenous contrast. RADIATION DOSE REDUCTION: This exam was performed according to the departmental dose-optimization program which includes automated exposure control, adjustment of the mA and/or kV according to patient size and/or use of iterative reconstruction technique. CONTRAST:  OMNIPAQUE IOHEXOL 300 MG/ML  SOLN COMPARISON:  None Available. FINDINGS: CHEST: Cardiovascular: No aortic injury. The thoracic aorta is normal in caliber. The heart is normal in size. No significant pericardial effusion. Mild atherosclerotic plaque. Mediastinum/Nodes: No pneumomediastinum. No mediastinal hematoma. The esophagus is unremarkable. The thyroid is unremarkable. The central airways are patent. No hilar or axillary lymphadenopathy. Nonspecific prevascular 0.9 cm lymph node (2:26). Lungs/Pleura: No focal consolidation. Couple right lower lobe pulmonary micronodules (4: 83, 91). No pulmonary mass. No pulmonary contusion or laceration. No pneumatocele formation. No pleural effusion. No pneumothorax. No hemothorax. Musculoskeletal/Chest wall: No chest wall mass.  No acute rib or sternal fracture. No spinal fracture. Bilateral shoulder degenerative changes. ABDOMEN / PELVIS: Hepatobiliary: Not enlarged. Nodular hepatic contour. No focal lesion. No laceration or subcapsular hematoma. Cholelithiasis. No gallbladder wall thickening or pericholecystic fluid. No biliary ductal dilatation. Pancreas: Normal pancreatic contour. No main pancreatic duct dilatation. Spleen: Not enlarged. No focal lesion. No laceration, subcapsular hematoma, or vascular injury. Adrenals/Urinary Tract: No nodularity bilaterally. Bilateral kidneys enhance symmetrically. No hydronephrosis. No contusion, laceration, or subcapsular hematoma. Fluid density lesion within the right kidney likely represents a simple renal cyst. Simple renal cysts, in the absence of clinically indicated signs/symptoms, require no independent follow-up. No injury to the vascular structures or collecting systems. No hydroureter. The urinary bladder is unremarkable. Stomach/Bowel: No small or large bowel wall thickening or dilatation. Colonic diverticulosis. The appendix is unremarkable. Vasculature/Lymphatics: Mild atherosclerotic plaque. No abdominal aorta or iliac aneurysm. No active contrast extravasation or pseudoaneurysm. No abdominal, pelvic, inguinal lymphadenopathy. Reproductive: Uterus and bilateral ovaries are unremarkable. No adnexal mass. Other: No simple free fluid ascites. No pneumoperitoneum. No hemoperitoneum. No mesenteric hematoma identified. No organized fluid collection. Musculoskeletal: No significant soft tissue hematoma. No acute pelvic fracture. No spinal fracture. L2-L3 posterior disc osteophyte complex formation. Ports and Devices: None. IMPRESSION: 1. No acute intrathoracic, intra-abdominal, intrapelvic traumatic injury. 2. No acute fracture or traumatic malalignment of the thoracic or lumbar spine. 3. Other imaging findings of potential clinical significance: Question cirrhotic morphology of the  liver-recommend nonemergent outpatient right upper quadrant ultrasound. Colonic diverticulosis with no acute diverticulitis. Electronically Signed   By: Tish Frederickson M.D.   On: 04/11/2023 19:42   CT Head Wo Contrast  Result Date: 04/11/2023 CLINICAL DATA:  Head trauma, minor (Age >= 65y); Neck trauma (Age >= 65y) Pt to ED for chest pressure 5/10 since 2 days. Pt fell on Tuesday and since then has been having pain in whole body also. Also is having SOB when climbs stairs at home, 13 steps. When  gets to top of stairs, is out of breath. EXAM: CT HEAD WITHOUT CONTRAST CT CERVICAL SPINE WITHOUT CONTRAST TECHNIQUE: Multidetector CT imaging of the head and cervical spine was performed following the standard protocol without intravenous contrast. Multiplanar CT image reconstructions of the cervical spine were also generated. RADIATION DOSE REDUCTION: This exam was performed according to the departmental dose-optimization program which includes automated exposure control, adjustment of the mA and/or kV according to patient size and/or use of iterative reconstruction technique. COMPARISON:  CT C-spine 10/16/2021 FINDINGS: CT HEAD FINDINGS Brain: No evidence of large-territorial acute infarction. No parenchymal hemorrhage. No mass lesion. No extra-axial collection. No mass effect or midline shift. No hydrocephalus. Basilar cisterns are patent. Vascular: No hyperdense vessel. Atherosclerotic calcifications are present within the cavernous internal carotid arteries. Skull: No acute fracture or focal lesion. Sinuses/Orbits: Paranasal sinuses and mastoid air cells are clear. The orbits are unremarkable. Other: None. CT CERVICAL SPINE FINDINGS Alignment: Normal. Skull base and vertebrae: Multilevel moderate degenerative changes spine. Moderate left C4-C5 osseous neural foraminal stenosis. No associated severe osseous neural foraminal or central canal stenosis. No acute fracture. No aggressive appearing focal osseous lesion  or focal pathologic process. Soft tissues and spinal canal: No prevertebral fluid or swelling. No visible canal hematoma. Upper chest: Unremarkable. Other: None. IMPRESSION: 1. No acute intracranial abnormality. 2. No acute displaced fracture or traumatic listhesis of the cervical spine. Electronically Signed   By: Tish Frederickson M.D.   On: 04/11/2023 19:28   CT Cervical Spine Wo Contrast  Result Date: 04/11/2023 CLINICAL DATA:  Head trauma, minor (Age >= 65y); Neck trauma (Age >= 65y) Pt to ED for chest pressure 5/10 since 2 days. Pt fell on Tuesday and since then has been having pain in whole body also. Also is having SOB when climbs stairs at home, 13 steps. When gets to top of stairs, is out of breath. EXAM: CT HEAD WITHOUT CONTRAST CT CERVICAL SPINE WITHOUT CONTRAST TECHNIQUE: Multidetector CT imaging of the head and cervical spine was performed following the standard protocol without intravenous contrast. Multiplanar CT image reconstructions of the cervical spine were also generated. RADIATION DOSE REDUCTION: This exam was performed according to the departmental dose-optimization program which includes automated exposure control, adjustment of the mA and/or kV according to patient size and/or use of iterative reconstruction technique. COMPARISON:  CT C-spine 10/16/2021 FINDINGS: CT HEAD FINDINGS Brain: No evidence of large-territorial acute infarction. No parenchymal hemorrhage. No mass lesion. No extra-axial collection. No mass effect or midline shift. No hydrocephalus. Basilar cisterns are patent. Vascular: No hyperdense vessel. Atherosclerotic calcifications are present within the cavernous internal carotid arteries. Skull: No acute fracture or focal lesion. Sinuses/Orbits: Paranasal sinuses and mastoid air cells are clear. The orbits are unremarkable. Other: None. CT CERVICAL SPINE FINDINGS Alignment: Normal. Skull base and vertebrae: Multilevel moderate degenerative changes spine. Moderate left C4-C5  osseous neural foraminal stenosis. No associated severe osseous neural foraminal or central canal stenosis. No acute fracture. No aggressive appearing focal osseous lesion or focal pathologic process. Soft tissues and spinal canal: No prevertebral fluid or swelling. No visible canal hematoma. Upper chest: Unremarkable. Other: None. IMPRESSION: 1. No acute intracranial abnormality. 2. No acute displaced fracture or traumatic listhesis of the cervical spine. Electronically Signed   By: Tish Frederickson M.D.   On: 04/11/2023 19:28   DG Chest 2 View  Result Date: 04/11/2023 CLINICAL DATA:  Chest pain and dyspnea with exertion. EXAM: CHEST - 2 VIEW COMPARISON:  Chest x-ray dated 02/12/2016 FINDINGS: Heart  size and mediastinal contours are stable. Lungs are clear. No pleural effusion or pneumothorax is seen. Osseous structures about the chest are unremarkable. IMPRESSION: No active cardiopulmonary disease. No evidence of pneumonia or pulmonary edema. Electronically Signed   By: Bary Richard M.D.   On: 04/11/2023 17:32               LOS: 1 day   Kento Gossman  [facility]   Pager on [email or web address]. If 7PM-7AM, please contact night-coverage at [email or web address]     04/13/2023, 3:33 PM

## 2023-04-13 NOTE — Progress Notes (Signed)
Patient still NPO post procedure with CBG 60.  Per protocol, 12.5g D50 given via PIV.  Patient started on D5NS at 75 per orders.  CBG recovered to 118 after treatment.

## 2023-04-13 NOTE — Op Note (Addendum)
[facility] Patient Name: Holly Schneider Procedure Date: 04/13/2023 2:06 PM MRN: [id number] Account #: [id number] Date of Birth: [birth year] Admit Type: Inpatient Age: 77 Room: [facility] ENDO ROOM 4 Gender: Female Note Status: Finalized Instrument Name: Prentice Docker [id number] Procedure:             Colonoscopy Indications:           Iron deficiency anemia Providers:             Midge Minium MD, MD Medicines:             Propofol per Anesthesia Complications:         No immediate complications. Procedure:             Pre-Anesthesia Assessment:                        - Prior to the procedure, a History and Physical was                         performed, and patient medications and allergies were                         reviewed. The patient's tolerance of previous                         anesthesia was also reviewed. The risks and benefits                         of the procedure and the sedation options and risks                         were discussed with the patient. All questions were                         answered, and informed consent was obtained. Prior                         Anticoagulants: The patient has taken no anticoagulant                         or antiplatelet agents. ASA Grade Assessment: II - A                         patient with mild systemic disease. After reviewing                         the risks and benefits, the patient was deemed in                         satisfactory condition to undergo the procedure.                        After obtaining informed consent, the colonoscope was                         passed under direct vision. Throughout the procedure,                         the patient's blood pressure, pulse,  and oxygen                         saturations were monitored continuously. The                         Colonoscope was introduced through the anus and                         advanced to the the cecum, identified by  appendiceal                         orifice and ileocecal valve. The colonoscopy was                         performed without difficulty. The patient tolerated                         the procedure well. The quality of the bowel                         preparation was good. Findings:      The perianal and digital rectal examinations were normal.      Multiple small-mouthed diverticula were found in the sigmoid colon. Impression:            - Diverticulosis in the sigmoid colon.                        - No specimens collected. Recommendation:        - Discharge patient to home.                        - Resume previous diet.                        - Continue present medications.                        - To visualize the small bowel, perform video capsule                         endoscopy. Procedure Code(s):     --- Professional ---                        [phone], Colonoscopy, flexible; diagnostic, including                         collection of specimen(s) by brushing or washing, when                         performed (separate procedure) Diagnosis Code(s):     --- Professional ---                        D50.9, Iron deficiency anemia, unspecified CPT copyright 2022 American Medical Association. All rights reserved. The codes documented in this report are preliminary and upon coder review may  be revised to meet current compliance requirements. Midge Minium MD, MD 04/13/2023 3:14:55 PM This report has been signed electronically. Number of Addenda: 0 Note Initiated On: 04/13/2023 2:06 PM Scope Withdrawal  Time: 0 hours 4 minutes 33 seconds  Total Procedure Duration: 0 hours 22 minutes 15 seconds  Estimated Blood Loss:  Estimated blood loss: none.      [facility]

## 2023-04-13 NOTE — Anesthesia Preprocedure Evaluation (Addendum)
Anesthesia Evaluation  Patient identified by MRN, date of birth, ID band Patient awake    Reviewed: Allergy & Precautions, NPO status , Patient's Chart, lab work & pertinent test results  History of Anesthesia Complications Negative for: history of anesthetic complications  Airway Mallampati: III  TM Distance: >3 FB Neck ROM: full    Dental  (+) Edentulous Upper, Edentulous Lower   Pulmonary neg pulmonary ROS   Pulmonary exam normal        Cardiovascular hypertension, On Medications Normal cardiovascular exam     Neuro/Psych  PSYCHIATRIC DISORDERS     Dementia negative neurological ROS     GI/Hepatic negative GI ROS,,,(+) Cirrhosis         Endo/Other  diabetes    Renal/GU negative Renal ROS  negative genitourinary   Musculoskeletal   Abdominal   Peds  Hematology  (+) Blood dyscrasia, anemia   Anesthesia Other Findings Past Medical History: No date: Arthritis 12/07/2015: Benign essential hypertension 10/18/2018: Cirrhosis of liver without ascites No date: Dementia No date: Diabetes No date: Dysphagia No date: HLD (hyperlipidemia) No date: HTN (hypertension) No date: Incomplete defecation 11/07/2019: Mixed hyperlipidemia 02/28/2015: Moderate mitral insufficiency No date: Urinary incontinence 06/03/2016: Vaginal atrophy No date: Wears dentures     Comment:  partial upper and lower  Past Surgical History: 11/28/2019: COLONOSCOPY WITH PROPOFOL; N/A     Comment:  Procedure: COLONOSCOPY WITH PROPOFOL;  Surgeon: Midge Minium, MD;  Location: [facility];  Service:               Endoscopy;  Laterality: N/A;  Dioabetes - insulin 07/29/2022: ESOPHAGOGASTRODUODENOSCOPY (EGD) WITH PROPOFOL; N/A     Comment:  Procedure: ESOPHAGOGASTRODUODENOSCOPY (EGD) WITH               PROPOFOL;  Surgeon: Midge Minium, MD;  Location: [facility]               ENDOSCOPY;  Service: Endoscopy;  Laterality: N/A; No date:  TUBAL LIGATION  BMI    Body Mass Index: 21.03 kg/m      Reproductive/Obstetrics negative OB ROS                             Anesthesia Physical Anesthesia Plan  ASA: 3  Anesthesia Plan: General   Post-op Pain Management: Minimal or no pain anticipated   Induction: Intravenous  PONV Risk Score and Plan: Propofol infusion and TIVA  Airway Management Planned: Natural Airway and Nasal Cannula  Additional Equipment:   Intra-op Plan:   Post-operative Plan:   Informed Consent: I have reviewed the patients History and Physical, chart, labs and discussed the procedure including the risks, benefits and alternatives for the proposed anesthesia with the patient or authorized representative who has indicated his/her understanding and acceptance.     Dental Advisory Given  Plan Discussed with: Anesthesiologist, CRNA and Surgeon  Anesthesia Plan Comments: (Patient consented for risks of anesthesia including but not limited to:  - adverse reactions to medications - risk of airway placement if required - damage to eyes, teeth, lips or other oral mucosa - nerve damage due to positioning  - sore throat or hoarseness - Damage to heart, brain, nerves, lungs, other parts of body or loss of life  Patient voiced understanding.)       Anesthesia Quick Evaluation

## 2023-04-13 NOTE — Transfer of Care (Signed)
Immediate Anesthesia Transfer of Care Note  Patient: Novaly Lookabaugh  Procedure(s) Performed: ESOPHAGOGASTRODUODENOSCOPY (EGD) WITH PROPOFOL COLONOSCOPY WITH PROPOFOL  Patient Location: Endoscopy Unit  Anesthesia Type:General  Level of Consciousness: drowsy  Airway & Oxygen Therapy: Patient Spontanous Breathing  Post-op Assessment: Report given to RN and Post -op Vital signs reviewed and stable  Post vital signs: Reviewed and stable  Last Vitals:  Vitals Value Taken Time  BP    Temp    Pulse 64 04/13/23 1517  Resp 16 04/13/23 1517  SpO2 96 % 04/13/23 1517  Vitals shown include unvalidated device data.  Last Pain:  Vitals:   04/13/23 1407  TempSrc: Tympanic  PainSc: 0-No pain         Complications: No notable events documented.

## 2023-04-13 NOTE — Op Note (Signed)
[facility] Gastroenterology Patient Name: Holly Schneider Procedure Date: 04/13/2023 2:33 PM MRN: [id number] Account #: [id number] Date of Birth: Jul 19, 1946 Admit Type: Inpatient Age: 77 Room: [facility] ENDO ROOM 4 Gender: Female Note Status: Finalized Instrument Name: Upper Endoscope [id number] Procedure:             Upper GI endoscopy Indications:           Iron deficiency anemia, Weight loss Providers:             Midge Minium MD, MD Medicines:             Propofol per Anesthesia Complications:         No immediate complications. Procedure:             Pre-Anesthesia Assessment:                        - Prior to the procedure, a History and Physical was                         performed, and patient medications and allergies were                         reviewed. The patient's tolerance of previous                         anesthesia was also reviewed. The risks and benefits                         of the procedure and the sedation options and risks                         were discussed with the patient. All questions were                         answered, and informed consent was obtained. Prior                         Anticoagulants: The patient has taken no anticoagulant                         or antiplatelet agents. ASA Grade Assessment: II - A                         patient with mild systemic disease. After reviewing                         the risks and benefits, the patient was deemed in                         satisfactory condition to undergo the procedure.                        After obtaining informed consent, the endoscope was                         passed under direct vision. Throughout the procedure,  the patient's blood pressure, pulse, and oxygen                         saturations were monitored continuously. The Endoscope                         was introduced through the mouth, and advanced to the                          third part of duodenum. The upper GI endoscopy was                         accomplished without difficulty. The patient tolerated                         the procedure well. Findings:      The examined esophagus was normal.      The entire examined stomach was normal.      The examined duodenum was normal. Impression:            - Normal esophagus.                        - Normal stomach.                        - Normal examined duodenum.                        - No specimens collected. Recommendation:        - Return patient to hospital ward for ongoing care.                        - Resume previous diet.                        - Continue present medications.                        - Perform a colonoscopy today. Procedure Code(s):     --- Professional ---                        [phone], Esophagogastroduodenoscopy, flexible,                         transoral; diagnostic, including collection of                         specimen(s) by brushing or washing, when performed                         (separate procedure) Diagnosis Code(s):     --- Professional ---                        D50.9, Iron deficiency anemia, unspecified                        R63.4, Abnormal weight loss CPT copyright 2022 American Medical Association. All rights reserved. The codes documented in this report are preliminary and upon coder review may  be revised to meet current  compliance requirements. Midge Minium MD, MD 04/13/2023 2:49:45 PM This report has been signed electronically. Number of Addenda: 0 Note Initiated On: 04/13/2023 2:33 PM Estimated Blood Loss:  Estimated blood loss: none.      [facility]

## 2023-04-13 NOTE — Progress Notes (Signed)
Pt NPO for procedure with CBG 65.  Per protocol, 12.5g D50 given via PIV.  CBG on recheck is 147 and patient transported to endoscopy for procedure.

## 2023-04-14 ENCOUNTER — Encounter: Payer: Self-pay | Admitting: Gastroenterology

## 2023-04-14 DIAGNOSIS — D649 Anemia, unspecified: Secondary | ICD-10-CM | POA: Diagnosis not present

## 2023-04-14 DIAGNOSIS — K7460 Unspecified cirrhosis of liver: Secondary | ICD-10-CM | POA: Diagnosis not present

## 2023-04-14 DIAGNOSIS — K7581 Nonalcoholic steatohepatitis (NASH): Secondary | ICD-10-CM | POA: Diagnosis not present

## 2023-04-14 LAB — GLUCOSE, CAPILLARY
Glucose-Capillary: 93 mg/dL (ref 70–99)
Glucose-Capillary: 183 mg/dL — ABNORMAL HIGH (ref 70–99)

## 2023-04-14 SURGERY — IMAGING PROCEDURE, GI TRACT, INTRALUMINAL, VIA CAPSULE

## 2023-04-14 MED ORDER — FERROUS SULFATE 325 (65 FE) MG PO TBEC: 325.0000 mg | DELAYED_RELEASE_TABLET | Freq: Every day | ORAL | 0 refills | Status: DC

## 2023-04-14 NOTE — TOC CM/SW Note (Signed)
  Transition of Care [facility]) Screening Note   Patient Details  Name: Holly Schneider Date of Birth: 1946/09/04   Transition of Care [facility]) CM/SW Contact:    Allena Katz, LCSW Phone Number: 04/14/2023, 9:27 AM    Transition of Care Department [facility]) has reviewed patient and no TOC needs have been identified at this time. We will continue to monitor patient advancement through interdisciplinary progression rounds. If new patient transition needs arise, please place a TOC consult.

## 2023-04-14 NOTE — Discharge Summary (Addendum)
Physician Discharge Summary   Patient: Holly Schneider MRN: [id number] DOB: 08-16-1946  Admit date:     04/11/2023  Discharge date: 04/14/23  Discharge Physician: Lurene Shadow   PCP: Gavin Potters, MD   Recommendations at discharge:   Follow-up with PCP in 1 week Follow-up with Dr. Servando Snare, gastroenterologist (office will call to schedule)  Discharge Diagnoses: Principal Problem:   Symptomatic anemia Active Problems:   Chest pain   Benign essential hypertension   Liver cirrhosis secondary to NASH   Dementia   Personal history of fall   Diabetes  Resolved Problems:   * No resolved hospital problems. *  Hospital Course:  Holly Schneider is a 77 y.o. female with medical history significant for diabetes mellitus, hypertension, Elita Boone cirrhosis, dementia, history of fall in 2022, who presented to the hospital because of shortness of breath with exertion, general weakness, exertional chest pain and mechanical fall.  Reportedly, patient tripped over her walker and fell.   Hemoglobin in the ED emergency department was 6.1.  She was admitted to the hospital for severe anemia.  She was transfused with 2 units of packed red blood cells.   Assessment and Plan:  Symptomatic, severe anemia; iron deficiency anemia: Suspected blood loss anemia.  Hemoglobin is stable at 10.6.  S/p transfusion with 2 units of PRBCs.   S/p infusion with IV iron sucrose x 2 doses.  S/p EGD which was unremarkable.  S/p colonoscopy which showed diverticulosis.  No evidence of bleeding was found on endoscopy. Patient was given a capsule for capsule endoscopy.  She will follow-up with gastroenterologist as an outpatient for the report.  She will be discharged on ferrous sulfate pills.       Latest Reference Range & Units Most Recent  Iron 28 - 170 ug/dL 12 (L) 04/06/80 19:14  UIBC ug/dL 782 9/56/21 30:86  TIBC 250 - 450 ug/dL 578 (H) 4/69/62 95:28  Saturation Ratios 10.4 - 31.8 % 3 (L) 04/11/23 20:23   Ferritin 11 - 307 ng/mL 4 (L) 04/11/23 20:23  Folate >5.9 ng/mL 25.0 04/11/23 20:23      Exertional chest pain and shortness of breath: Likely due to symptomatic anemia.  Resolved.  Troponins negative.       S/p mechanical fall: Patient ambulated with assistance with mobility specialist.     Liver cirrhosis secondary to NASH: Compensated.  Follow-up with GI     Hypoglycemia because of n.p.o. status: Resolved.      Other comorbidities include type II DM, hypertension, dementia     She is feeling better she is deemed stable for discharge to home today.  Okay for discharge from Dr. Annabell Sabal standpoint.  Patient will follow-up capsule endoscopy report with him in the outpatient setting.  Discharge plan was discussed with [name], daughter-in-law.      Consultants: Gastroenterologist Procedures performed: EGD, colonoscopy, capsule endoscopy Disposition: Home Diet recommendation:  Discharge Diet Orders (From admission, onward)     Start     Ordered   04/14/23 0000  Diet - low sodium heart healthy        04/14/23 1533   04/14/23 0000  Diet Carb Modified        04/14/23 1533           Cardiac and Carb modified diet DISCHARGE MEDICATION: Allergies as of 04/14/2023       Reactions   Glucophage [metformin] Diarrhea   Mobic [meloxicam]         Medication List     STOP  taking these medications    ketoconazole 2 % cream Commonly known as: NIZORAL   lisinopril 10 MG tablet Commonly known as: ZESTRIL   PRESERVISION AREDS 2 PO   terbinafine 250 MG tablet Commonly known as: LamISIL       TAKE these medications    acetaminophen 500 MG tablet Commonly known as: TYLENOL Take 500 mg by mouth 2 (two) times daily. One tablet in the morning and one tablet in the evening   atorvastatin 40 MG tablet Commonly known as: LIPITOR   Calcium Carb-Cholecalciferol 600-10 MG-MCG Tabs Take 1 tablet by mouth 2 (two) times daily.   cetirizine 10 MG tablet Commonly known as:  ZYRTEC Take 10 mg by mouth daily.   donepezil 10 MG tablet Commonly known as: ARICEPT Take 10 mg by mouth at bedtime.   ferrous sulfate 325 (65 FE) MG EC tablet Take 1 tablet (325 mg total) by mouth daily with breakfast.   Jardiance 10 MG Tabs tablet Generic drug: empagliflozin Take 10 mg by mouth daily.   Levemir FlexTouch 100 UNIT/ML FlexPen Generic drug: insulin detemir Inject 30 Units into the skin daily as needed.   omega-3 acid ethyl esters 1 g capsule Commonly known as: LOVAZA Take 1 capsule by mouth daily.        Follow-up Information     Midge Minium, MD. Schedule an appointment as soon as possible for a visit in 1 week(s).   Specialty: Gastroenterology Contact information: [address] [place]  [place] [address] [phone]                Discharge Exam: Ceasar Mons Weights   04/11/23 1715  Weight: 52.2 kg   GEN: NAD SKIN: Warm and dry EYES: No pallor or icterus ENT: MMM CV: RRR PULM: CTA B ABD: soft, ND, NT, +BS CNS: AAO x 3, non focal EXT: No edema or tenderness   Condition at discharge: good  The results of significant diagnostics from this hospitalization (including imaging, microbiology, ancillary and laboratory) are listed below for reference.   Imaging Studies: CT CHEST ABDOMEN PELVIS W CONTRAST  Result Date: 04/11/2023 CLINICAL DATA:  Polytrauma, blunt.  Fall EXAM: CT CHEST, ABDOMEN, AND PELVIS WITH CONTRAST TECHNIQUE: Multidetector CT imaging of the chest, abdomen and pelvis was performed following the standard protocol during bolus administration of intravenous contrast. RADIATION DOSE REDUCTION: This exam was performed according to the departmental dose-optimization program which includes automated exposure control, adjustment of the mA and/or kV according to patient size and/or use of iterative reconstruction technique. CONTRAST:  OMNIPAQUE IOHEXOL 300 MG/ML  SOLN COMPARISON:  None Available. FINDINGS: CHEST: Cardiovascular: No  aortic injury. The thoracic aorta is normal in caliber. The heart is normal in size. No significant pericardial effusion. Mild atherosclerotic plaque. Mediastinum/Nodes: No pneumomediastinum. No mediastinal hematoma. The esophagus is unremarkable. The thyroid is unremarkable. The central airways are patent. No hilar or axillary lymphadenopathy. Nonspecific prevascular 0.9 cm lymph node (2:26). Lungs/Pleura: No focal consolidation. Couple right lower lobe pulmonary micronodules (4: 83, 91). No pulmonary mass. No pulmonary contusion or laceration. No pneumatocele formation. No pleural effusion. No pneumothorax. No hemothorax. Musculoskeletal/Chest wall: No chest wall mass. No acute rib or sternal fracture. No spinal fracture. Bilateral shoulder degenerative changes. ABDOMEN / PELVIS: Hepatobiliary: Not enlarged. Nodular hepatic contour. No focal lesion. No laceration or subcapsular hematoma. Cholelithiasis. No gallbladder wall thickening or pericholecystic fluid. No biliary ductal dilatation. Pancreas: Normal pancreatic contour. No main pancreatic duct dilatation. Spleen: Not enlarged. No focal lesion. No laceration,  subcapsular hematoma, or vascular injury. Adrenals/Urinary Tract: No nodularity bilaterally. Bilateral kidneys enhance symmetrically. No hydronephrosis. No contusion, laceration, or subcapsular hematoma. Fluid density lesion within the right kidney likely represents a simple renal cyst. Simple renal cysts, in the absence of clinically indicated signs/symptoms, require no independent follow-up. No injury to the vascular structures or collecting systems. No hydroureter. The urinary bladder is unremarkable. Stomach/Bowel: No small or large bowel wall thickening or dilatation. Colonic diverticulosis. The appendix is unremarkable. Vasculature/Lymphatics: Mild atherosclerotic plaque. No abdominal aorta or iliac aneurysm. No active contrast extravasation or pseudoaneurysm. No abdominal, pelvic, inguinal  lymphadenopathy. Reproductive: Uterus and bilateral ovaries are unremarkable. No adnexal mass. Other: No simple free fluid ascites. No pneumoperitoneum. No hemoperitoneum. No mesenteric hematoma identified. No organized fluid collection. Musculoskeletal: No significant soft tissue hematoma. No acute pelvic fracture. No spinal fracture. L2-L3 posterior disc osteophyte complex formation. Ports and Devices: None. IMPRESSION: 1. No acute intrathoracic, intra-abdominal, intrapelvic traumatic injury. 2. No acute fracture or traumatic malalignment of the thoracic or lumbar spine. 3. Other imaging findings of potential clinical significance: Question cirrhotic morphology of the liver-recommend nonemergent outpatient right upper quadrant ultrasound. Colonic diverticulosis with no acute diverticulitis. Electronically Signed   By: Tish Frederickson M.D.   On: 04/11/2023 19:42   CT Head Wo Contrast  Result Date: 04/11/2023 CLINICAL DATA:  Head trauma, minor (Age >= 65y); Neck trauma (Age >= 65y) Pt to ED for chest pressure 5/10 since 2 days. Pt fell on Tuesday and since then has been having pain in whole body also. Also is having SOB when climbs stairs at home, 13 steps. When gets to top of stairs, is out of breath. EXAM: CT HEAD WITHOUT CONTRAST CT CERVICAL SPINE WITHOUT CONTRAST TECHNIQUE: Multidetector CT imaging of the head and cervical spine was performed following the standard protocol without intravenous contrast. Multiplanar CT image reconstructions of the cervical spine were also generated. RADIATION DOSE REDUCTION: This exam was performed according to the departmental dose-optimization program which includes automated exposure control, adjustment of the mA and/or kV according to patient size and/or use of iterative reconstruction technique. COMPARISON:  CT C-spine 10/16/2021 FINDINGS: CT HEAD FINDINGS Brain: No evidence of large-territorial acute infarction. No parenchymal hemorrhage. No mass lesion. No extra-axial  collection. No mass effect or midline shift. No hydrocephalus. Basilar cisterns are patent. Vascular: No hyperdense vessel. Atherosclerotic calcifications are present within the cavernous internal carotid arteries. Skull: No acute fracture or focal lesion. Sinuses/Orbits: Paranasal sinuses and mastoid air cells are clear. The orbits are unremarkable. Other: None. CT CERVICAL SPINE FINDINGS Alignment: Normal. Skull base and vertebrae: Multilevel moderate degenerative changes spine. Moderate left C4-C5 osseous neural foraminal stenosis. No associated severe osseous neural foraminal or central canal stenosis. No acute fracture. No aggressive appearing focal osseous lesion or focal pathologic process. Soft tissues and spinal canal: No prevertebral fluid or swelling. No visible canal hematoma. Upper chest: Unremarkable. Other: None. IMPRESSION: 1. No acute intracranial abnormality. 2. No acute displaced fracture or traumatic listhesis of the cervical spine. Electronically Signed   By: Tish Frederickson M.D.   On: 04/11/2023 19:28   CT Cervical Spine Wo Contrast  Result Date: 04/11/2023 CLINICAL DATA:  Head trauma, minor (Age >= 65y); Neck trauma (Age >= 65y) Pt to ED for chest pressure 5/10 since 2 days. Pt fell on Tuesday and since then has been having pain in whole body also. Also is having SOB when climbs stairs at home, 13 steps. When gets to top of stairs, is out of  breath. EXAM: CT HEAD WITHOUT CONTRAST CT CERVICAL SPINE WITHOUT CONTRAST TECHNIQUE: Multidetector CT imaging of the head and cervical spine was performed following the standard protocol without intravenous contrast. Multiplanar CT image reconstructions of the cervical spine were also generated. RADIATION DOSE REDUCTION: This exam was performed according to the departmental dose-optimization program which includes automated exposure control, adjustment of the mA and/or kV according to patient size and/or use of iterative reconstruction technique.  COMPARISON:  CT C-spine 10/16/2021 FINDINGS: CT HEAD FINDINGS Brain: No evidence of large-territorial acute infarction. No parenchymal hemorrhage. No mass lesion. No extra-axial collection. No mass effect or midline shift. No hydrocephalus. Basilar cisterns are patent. Vascular: No hyperdense vessel. Atherosclerotic calcifications are present within the cavernous internal carotid arteries. Skull: No acute fracture or focal lesion. Sinuses/Orbits: Paranasal sinuses and mastoid air cells are clear. The orbits are unremarkable. Other: None. CT CERVICAL SPINE FINDINGS Alignment: Normal. Skull base and vertebrae: Multilevel moderate degenerative changes spine. Moderate left C4-C5 osseous neural foraminal stenosis. No associated severe osseous neural foraminal or central canal stenosis. No acute fracture. No aggressive appearing focal osseous lesion or focal pathologic process. Soft tissues and spinal canal: No prevertebral fluid or swelling. No visible canal hematoma. Upper chest: Unremarkable. Other: None. IMPRESSION: 1. No acute intracranial abnormality. 2. No acute displaced fracture or traumatic listhesis of the cervical spine. Electronically Signed   By: Tish Frederickson M.D.   On: 04/11/2023 19:28   DG Chest 2 View  Result Date: 04/11/2023 CLINICAL DATA:  Chest pain and dyspnea with exertion. EXAM: CHEST - 2 VIEW COMPARISON:  Chest x-ray dated 02/12/2016 FINDINGS: Heart size and mediastinal contours are stable. Lungs are clear. No pleural effusion or pneumothorax is seen. Osseous structures about the chest are unremarkable. IMPRESSION: No active cardiopulmonary disease. No evidence of pneumonia or pulmonary edema. Electronically Signed   By: Bary Richard M.D.   On: 04/11/2023 17:32    Microbiology: Results for orders placed or performed during the hospital encounter of 11/25/19  SARS CORONAVIRUS 2 (TAT 6-24 HRS) Nasopharyngeal Nasopharyngeal Swab     Status: None   Collection Time: 11/25/19 11:14 AM    Specimen: Nasopharyngeal Swab  Result Value Ref Range Status   SARS Coronavirus 2 NEGATIVE NEGATIVE Final    Comment: (NOTE) SARS-CoV-2 target nucleic acids are NOT DETECTED. The SARS-CoV-2 RNA is generally detectable in upper and lower respiratory specimens during the acute phase of infection. Negative results do not preclude SARS-CoV-2 infection, do not rule out co-infections with other pathogens, and should not be used as the sole basis for treatment or other patient management decisions. Negative results must be combined with clinical observations, patient history, and epidemiological information. The expected result is Negative. Fact Sheet for Patients: HairSlick.no Fact Sheet for Healthcare Providers: quierodirigir.com This test is not yet approved or cleared by the [place] FDA and  has been authorized for detection and/or diagnosis of SARS-CoV-2 by FDA under an Emergency Use Authorization (EUA). This EUA will remain  in effect (meaning this test can be used) for the duration of the COVID-19 declaration under Section 56 4(b)(1) of the Act, 21 U.S.C. section 360bbb-3(b)(1), unless the authorization is terminated or revoked sooner. Performed at [facility], [address], [place], [place] [address]     Labs: CBC: Recent Labs  Lab 04/11/23 1717 04/12/23 0446 04/12/23 1026 04/13/23 0309  WBC 6.9  --   --  7.2  NEUTROABS  --   --   --  4.4  HGB 6.1* 9.7* 10.2* 10.6*  HCT 22.0*  --   --  35.5*  MCV 70.7*  --   --  74.3*  PLT 189  --   --  168   Basic Metabolic Panel: Recent Labs  Lab 04/11/23 1717 04/13/23 1139  NA 139 138  K 4.1 3.8  CL 110 108  CO2 20* 22  GLUCOSE 150* 64*  BUN 14 12  CREATININE 0.61 0.50  CALCIUM 8.5* 8.5*   Liver Function Tests: Recent Labs  Lab 04/11/23 1717  AST 51*  ALT 30  ALKPHOS 103  BILITOT 0.5  PROT 6.8  ALBUMIN 3.6   CBG: Recent Labs  Lab  04/13/23 1701 04/13/23 1829 04/13/23 2015 04/14/23 0826 04/14/23 1219  GLUCAP 60* 118* 76 93 183*    Discharge time spent: greater than 30 minutes.  Signed: Lurene Shadow, MD [facility] 04/14/2023

## 2023-04-15 LAB — CELIAC DISEASE PANEL
Endomysial Ab, IgA: NEGATIVE
IgA: 117 mg/dL (ref 64–422)
Tissue Transglutaminase Ab, IgA: <2 U/mL (ref 0–3)

## 2023-04-28 ENCOUNTER — Ambulatory Visit (INDEPENDENT_AMBULATORY_CARE_PROVIDER_SITE_OTHER): Payer: Medicare Other | Admitting: Gastroenterology

## 2023-04-28 ENCOUNTER — Encounter: Payer: Self-pay | Admitting: Gastroenterology

## 2023-04-28 ENCOUNTER — Ambulatory Visit: Payer: Medicare Other | Admitting: Gastroenterology

## 2023-04-28 VITALS — BP 110/67 | Ht 60.0 in | Wt 112.0 lb

## 2023-04-28 DIAGNOSIS — D509 Iron deficiency anemia, unspecified: Secondary | ICD-10-CM

## 2023-04-28 NOTE — Progress Notes (Signed)
Primary Care Physician: Gavin Potters, MD  Primary Gastroenterologist:  Dr. Midge Minium  Chief Complaint  Patient presents with   Hospitalization Follow-up    HPI: Holly Schneider is a 77 y.o. female here for follow-up after being in the hospital with anemia.  The patient had an upper endoscopy and colonoscopy by me when she was in the hospital without any source of the bleeding seen.  The patient was then recommended to have a capsule endoscopy.  The patient had a capsule endoscopy but from the chart it does not appear that the capsule endoscopy has been read.  The patient states that she feels well without any issues at the present time.  There was some concern of liver cirrhosis but the patient's albumin and platelets are normal.  Past Medical History:  Diagnosis Date   Arthritis    Benign essential hypertension 12/07/2015   Cirrhosis of liver without ascites (HCC) 10/18/2018   Dementia (HCC)    Diabetes (HCC)    Dysphagia    HLD (hyperlipidemia)    HTN (hypertension)    Incomplete defecation    Mixed hyperlipidemia 11/07/2019   Moderate mitral insufficiency 02/28/2015   Urinary incontinence    Vaginal atrophy 06/03/2016   Wears dentures    partial upper and lower    Current Outpatient Medications  Medication Sig Dispense Refill   acetaminophen (TYLENOL) 500 MG tablet Take 500 mg by mouth 2 (two) times daily. One tablet in the morning and one tablet in the evening     atorvastatin (LIPITOR) 40 MG tablet      Calcium Carb-Cholecalciferol 600-10 MG-MCG TABS Take 1 tablet by mouth 2 (two) times daily.     cetirizine (ZYRTEC) 10 MG tablet Take 10 mg by mouth daily.     donepezil (ARICEPT) 10 MG tablet Take 10 mg by mouth at bedtime.     ferrous sulfate 325 (65 FE) MG EC tablet Take 1 tablet (325 mg total) by mouth daily with breakfast. 30 tablet 0   JARDIANCE 10 MG TABS tablet Take 10 mg by mouth daily.     LEVEMIR FLEXTOUCH 100 UNIT/ML Pen Inject 30 Units into the skin  daily as needed.     omega-3 acid ethyl esters (LOVAZA) 1 g capsule Take 1 capsule by mouth daily.     No current facility-administered medications for this visit.    Allergies as of 04/28/2023 - Review Complete 04/28/2023  Allergen Reaction Noted   Glucophage [metformin] Diarrhea 11/21/2019   Mobic [meloxicam]  11/21/2019    ROS:  General: Negative for anorexia, weight loss, fever, chills, fatigue, weakness. ENT: Negative for hoarseness, difficulty swallowing , nasal congestion. CV: Negative for chest pain, angina, palpitations, dyspnea on exertion, peripheral edema.  Respiratory: Negative for dyspnea at rest, dyspnea on exertion, cough, sputum, wheezing.  GI: See history of present illness. GU:  Negative for dysuria, hematuria, urinary incontinence, urinary frequency, nocturnal urination.  Endo: Negative for unusual weight change.    Physical Examination:   BP 110/67 (BP Location: Left Arm, Patient Position: Sitting, Cuff Size: Normal)   Ht 5' (1.524 m)   Wt 112 lb (50.8 kg)   BMI 21.87 kg/m   General: Well-nourished, well-developed in no acute distress.  Eyes: No icterus. Conjunctivae pink. Neuro: Alert and oriented x 3.  Grossly intact. Skin: Warm and dry, no jaundice.   Psych: Alert and cooperative, normal mood and affect.  Labs:    Imaging Studies: CT CHEST ABDOMEN PELVIS W CONTRAST  Result Date: 04/11/2023 CLINICAL DATA:  Polytrauma, blunt.  Fall EXAM: CT CHEST, ABDOMEN, AND PELVIS WITH CONTRAST TECHNIQUE: Multidetector CT imaging of the chest, abdomen and pelvis was performed following the standard protocol during bolus administration of intravenous contrast. RADIATION DOSE REDUCTION: This exam was performed according to the departmental dose-optimization program which includes automated exposure control, adjustment of the mA and/or kV according to patient size and/or use of iterative reconstruction technique. CONTRAST:  OMNIPAQUE IOHEXOL 300 MG/ML  SOLN  COMPARISON:  None Available. FINDINGS: CHEST: Cardiovascular: No aortic injury. The thoracic aorta is normal in caliber. The heart is normal in size. No significant pericardial effusion. Mild atherosclerotic plaque. Mediastinum/Nodes: No pneumomediastinum. No mediastinal hematoma. The esophagus is unremarkable. The thyroid is unremarkable. The central airways are patent. No hilar or axillary lymphadenopathy. Nonspecific prevascular 0.9 cm lymph node (2:26). Lungs/Pleura: No focal consolidation. Couple right lower lobe pulmonary micronodules (4: 83, 91). No pulmonary mass. No pulmonary contusion or laceration. No pneumatocele formation. No pleural effusion. No pneumothorax. No hemothorax. Musculoskeletal/Chest wall: No chest wall mass. No acute rib or sternal fracture. No spinal fracture. Bilateral shoulder degenerative changes. ABDOMEN / PELVIS: Hepatobiliary: Not enlarged. Nodular hepatic contour. No focal lesion. No laceration or subcapsular hematoma. Cholelithiasis. No gallbladder wall thickening or pericholecystic fluid. No biliary ductal dilatation. Pancreas: Normal pancreatic contour. No main pancreatic duct dilatation. Spleen: Not enlarged. No focal lesion. No laceration, subcapsular hematoma, or vascular injury. Adrenals/Urinary Tract: No nodularity bilaterally. Bilateral kidneys enhance symmetrically. No hydronephrosis. No contusion, laceration, or subcapsular hematoma. Fluid density lesion within the right kidney likely represents a simple renal cyst. Simple renal cysts, in the absence of clinically indicated signs/symptoms, require no independent follow-up. No injury to the vascular structures or collecting systems. No hydroureter. The urinary bladder is unremarkable. Stomach/Bowel: No small or large bowel wall thickening or dilatation. Colonic diverticulosis. The appendix is unremarkable. Vasculature/Lymphatics: Mild atherosclerotic plaque. No abdominal aorta or iliac aneurysm. No active contrast  extravasation or pseudoaneurysm. No abdominal, pelvic, inguinal lymphadenopathy. Reproductive: Uterus and bilateral ovaries are unremarkable. No adnexal mass. Other: No simple free fluid ascites. No pneumoperitoneum. No hemoperitoneum. No mesenteric hematoma identified. No organized fluid collection. Musculoskeletal: No significant soft tissue hematoma. No acute pelvic fracture. No spinal fracture. L2-L3 posterior disc osteophyte complex formation. Ports and Devices: None. IMPRESSION: 1. No acute intrathoracic, intra-abdominal, intrapelvic traumatic injury. 2. No acute fracture or traumatic malalignment of the thoracic or lumbar spine. 3. Other imaging findings of potential clinical significance: Question cirrhotic morphology of the liver-recommend nonemergent outpatient right upper quadrant ultrasound. Colonic diverticulosis with no acute diverticulitis. Electronically Signed   By: Tish Frederickson M.D.   On: 04/11/2023 19:42   CT Head Wo Contrast  Result Date: 04/11/2023 CLINICAL DATA:  Head trauma, minor (Age >= 65y); Neck trauma (Age >= 65y) Pt to ED for chest pressure 5/10 since 2 days. Pt fell on Tuesday and since then has been having pain in whole body also. Also is having SOB when climbs stairs at home, 13 steps. When gets to top of stairs, is out of breath. EXAM: CT HEAD WITHOUT CONTRAST CT CERVICAL SPINE WITHOUT CONTRAST TECHNIQUE: Multidetector CT imaging of the head and cervical spine was performed following the standard protocol without intravenous contrast. Multiplanar CT image reconstructions of the cervical spine were also generated. RADIATION DOSE REDUCTION: This exam was performed according to the departmental dose-optimization program which includes automated exposure control, adjustment of the mA and/or kV according to patient size and/or use of iterative reconstruction  technique. COMPARISON:  CT C-spine 10/16/2021 FINDINGS: CT HEAD FINDINGS Brain: No evidence of large-territorial acute  infarction. No parenchymal hemorrhage. No mass lesion. No extra-axial collection. No mass effect or midline shift. No hydrocephalus. Basilar cisterns are patent. Vascular: No hyperdense vessel. Atherosclerotic calcifications are present within the cavernous internal carotid arteries. Skull: No acute fracture or focal lesion. Sinuses/Orbits: Paranasal sinuses and mastoid air cells are clear. The orbits are unremarkable. Other: None. CT CERVICAL SPINE FINDINGS Alignment: Normal. Skull base and vertebrae: Multilevel moderate degenerative changes spine. Moderate left C4-C5 osseous neural foraminal stenosis. No associated severe osseous neural foraminal or central canal stenosis. No acute fracture. No aggressive appearing focal osseous lesion or focal pathologic process. Soft tissues and spinal canal: No prevertebral fluid or swelling. No visible canal hematoma. Upper chest: Unremarkable. Other: None. IMPRESSION: 1. No acute intracranial abnormality. 2. No acute displaced fracture or traumatic listhesis of the cervical spine. Electronically Signed   By: Tish Frederickson M.D.   On: 04/11/2023 19:28   CT Cervical Spine Wo Contrast  Result Date: 04/11/2023 CLINICAL DATA:  Head trauma, minor (Age >= 65y); Neck trauma (Age >= 65y) Pt to ED for chest pressure 5/10 since 2 days. Pt fell on Tuesday and since then has been having pain in whole body also. Also is having SOB when climbs stairs at home, 13 steps. When gets to top of stairs, is out of breath. EXAM: CT HEAD WITHOUT CONTRAST CT CERVICAL SPINE WITHOUT CONTRAST TECHNIQUE: Multidetector CT imaging of the head and cervical spine was performed following the standard protocol without intravenous contrast. Multiplanar CT image reconstructions of the cervical spine were also generated. RADIATION DOSE REDUCTION: This exam was performed according to the departmental dose-optimization program which includes automated exposure control, adjustment of the mA and/or kV according  to patient size and/or use of iterative reconstruction technique. COMPARISON:  CT C-spine 10/16/2021 FINDINGS: CT HEAD FINDINGS Brain: No evidence of large-territorial acute infarction. No parenchymal hemorrhage. No mass lesion. No extra-axial collection. No mass effect or midline shift. No hydrocephalus. Basilar cisterns are patent. Vascular: No hyperdense vessel. Atherosclerotic calcifications are present within the cavernous internal carotid arteries. Skull: No acute fracture or focal lesion. Sinuses/Orbits: Paranasal sinuses and mastoid air cells are clear. The orbits are unremarkable. Other: None. CT CERVICAL SPINE FINDINGS Alignment: Normal. Skull base and vertebrae: Multilevel moderate degenerative changes spine. Moderate left C4-C5 osseous neural foraminal stenosis. No associated severe osseous neural foraminal or central canal stenosis. No acute fracture. No aggressive appearing focal osseous lesion or focal pathologic process. Soft tissues and spinal canal: No prevertebral fluid or swelling. No visible canal hematoma. Upper chest: Unremarkable. Other: None. IMPRESSION: 1. No acute intracranial abnormality. 2. No acute displaced fracture or traumatic listhesis of the cervical spine. Electronically Signed   By: Tish Frederickson M.D.   On: 04/11/2023 19:28   DG Chest 2 View  Result Date: 04/11/2023 CLINICAL DATA:  Chest pain and dyspnea with exertion. EXAM: CHEST - 2 VIEW COMPARISON:  Chest x-ray dated 02/12/2016 FINDINGS: Heart size and mediastinal contours are stable. Lungs are clear. No pleural effusion or pneumothorax is seen. Osseous structures about the chest are unremarkable. IMPRESSION: No active cardiopulmonary disease. No evidence of pneumonia or pulmonary edema. Electronically Signed   By: Bary Richard M.D.   On: 04/11/2023 17:32    Assessment and Plan:   Holly Schneider is a 77 y.o. y/o female who comes in today with a history of iron deficiency anemia.  The patient will have her  iron  levels checked again in addition to her CBC.  The patient will also have the capsule endoscopy read and if negative then the patient should be treated with iron supplementation for her iron deficiency anemia.  The patient and her daughter have been explained the plan and agree with it.     Midge Minium, MD. Clementeen Graham    Note: This dictation was prepared with Dragon dictation along with smaller phrase technology. Any transcriptional errors that result from this process are unintentional.

## 2023-04-28 NOTE — Progress Notes (Deleted)
Primary Care Physician: Gavin Potters, MD  Primary Gastroenterologist:  Dr. Midge Minium  No chief complaint on file.   HPI: Holly Schneider is a 77 y.o. female here with a history of cirrhosis and was in the hospital recently for anemia with a upper endoscopy not showing a source of the patient's anemia.  The patient was recommended to undergo a capsule endoscopy but due to the patient's discharge it does not seem that that was done.  The patient also suffers from dementia.  She had seen me in the past for possible liver cirrhosis and dysphagia.  Back in August of last year the patient underwent an upper endoscopy with dilation although the esophagus looked normal.  Past Medical History:  Diagnosis Date   Arthritis    Benign essential hypertension 12/07/2015   Cirrhosis of liver without ascites (HCC) 10/18/2018   Dementia (HCC)    Diabetes (HCC)    Dysphagia    HLD (hyperlipidemia)    HTN (hypertension)    Incomplete defecation    Mixed hyperlipidemia 11/07/2019   Moderate mitral insufficiency 02/28/2015   Urinary incontinence    Vaginal atrophy 06/03/2016   Wears dentures    partial upper and lower    Current Outpatient Medications  Medication Sig Dispense Refill   acetaminophen (TYLENOL) 500 MG tablet Take 500 mg by mouth 2 (two) times daily. One tablet in the morning and one tablet in the evening     atorvastatin (LIPITOR) 40 MG tablet      Calcium Carb-Cholecalciferol 600-10 MG-MCG TABS Take 1 tablet by mouth 2 (two) times daily.     cetirizine (ZYRTEC) 10 MG tablet Take 10 mg by mouth daily.     donepezil (ARICEPT) 10 MG tablet Take 10 mg by mouth at bedtime.     ferrous sulfate 325 (65 FE) MG EC tablet Take 1 tablet (325 mg total) by mouth daily with breakfast. 30 tablet 0   JARDIANCE 10 MG TABS tablet Take 10 mg by mouth daily.     LEVEMIR FLEXTOUCH 100 UNIT/ML Pen Inject 30 Units into the skin daily as needed.     omega-3 acid ethyl esters (LOVAZA) 1 g capsule Take 1  capsule by mouth daily.     No current facility-administered medications for this visit.    Allergies as of 04/28/2023 - Review Complete 04/13/2023  Allergen Reaction Noted   Glucophage [metformin] Diarrhea 11/21/2019   Mobic [meloxicam]  11/21/2019    ROS:  General: Negative for anorexia, weight loss, fever, chills, fatigue, weakness. ENT: Negative for hoarseness, difficulty swallowing , nasal congestion. CV: Negative for chest pain, angina, palpitations, dyspnea on exertion, peripheral edema.  Respiratory: Negative for dyspnea at rest, dyspnea on exertion, cough, sputum, wheezing.  GI: See history of present illness. GU:  Negative for dysuria, hematuria, urinary incontinence, urinary frequency, nocturnal urination.  Endo: Negative for unusual weight change.    Physical Examination:   There were no vitals taken for this visit.  General: Well-nourished, well-developed in no acute distress.  Eyes: No icterus. Conjunctivae pink. Lungs: Clear to auscultation bilaterally. Non-labored. Heart: Regular rate and rhythm, no murmurs rubs or gallops.  Abdomen: Bowel sounds are normal, nontender, nondistended, no hepatosplenomegaly or masses, no abdominal bruits or hernia , no rebound or guarding.   Extremities: No lower extremity edema. No clubbing or deformities. Neuro: Alert and oriented x 3.  Grossly intact. Skin: Warm and dry, no jaundice.   Psych: Alert and cooperative, normal mood and affect.  Labs:    Imaging Studies: CT CHEST ABDOMEN PELVIS W CONTRAST  Result Date: 04/11/2023 CLINICAL DATA:  Polytrauma, blunt.  Fall EXAM: CT CHEST, ABDOMEN, AND PELVIS WITH CONTRAST TECHNIQUE: Multidetector CT imaging of the chest, abdomen and pelvis was performed following the standard protocol during bolus administration of intravenous contrast. RADIATION DOSE REDUCTION: This exam was performed according to the departmental dose-optimization program which includes automated exposure control,  adjustment of the mA and/or kV according to patient size and/or use of iterative reconstruction technique. CONTRAST:  OMNIPAQUE IOHEXOL 300 MG/ML  SOLN COMPARISON:  None Available. FINDINGS: CHEST: Cardiovascular: No aortic injury. The thoracic aorta is normal in caliber. The heart is normal in size. No significant pericardial effusion. Mild atherosclerotic plaque. Mediastinum/Nodes: No pneumomediastinum. No mediastinal hematoma. The esophagus is unremarkable. The thyroid is unremarkable. The central airways are patent. No hilar or axillary lymphadenopathy. Nonspecific prevascular 0.9 cm lymph node (2:26). Lungs/Pleura: No focal consolidation. Couple right lower lobe pulmonary micronodules (4: 83, 91). No pulmonary mass. No pulmonary contusion or laceration. No pneumatocele formation. No pleural effusion. No pneumothorax. No hemothorax. Musculoskeletal/Chest wall: No chest wall mass. No acute rib or sternal fracture. No spinal fracture. Bilateral shoulder degenerative changes. ABDOMEN / PELVIS: Hepatobiliary: Not enlarged. Nodular hepatic contour. No focal lesion. No laceration or subcapsular hematoma. Cholelithiasis. No gallbladder wall thickening or pericholecystic fluid. No biliary ductal dilatation. Pancreas: Normal pancreatic contour. No main pancreatic duct dilatation. Spleen: Not enlarged. No focal lesion. No laceration, subcapsular hematoma, or vascular injury. Adrenals/Urinary Tract: No nodularity bilaterally. Bilateral kidneys enhance symmetrically. No hydronephrosis. No contusion, laceration, or subcapsular hematoma. Fluid density lesion within the right kidney likely represents a simple renal cyst. Simple renal cysts, in the absence of clinically indicated signs/symptoms, require no independent follow-up. No injury to the vascular structures or collecting systems. No hydroureter. The urinary bladder is unremarkable. Stomach/Bowel: No small or large bowel wall thickening or dilatation. Colonic  diverticulosis. The appendix is unremarkable. Vasculature/Lymphatics: Mild atherosclerotic plaque. No abdominal aorta or iliac aneurysm. No active contrast extravasation or pseudoaneurysm. No abdominal, pelvic, inguinal lymphadenopathy. Reproductive: Uterus and bilateral ovaries are unremarkable. No adnexal mass. Other: No simple free fluid ascites. No pneumoperitoneum. No hemoperitoneum. No mesenteric hematoma identified. No organized fluid collection. Musculoskeletal: No significant soft tissue hematoma. No acute pelvic fracture. No spinal fracture. L2-L3 posterior disc osteophyte complex formation. Ports and Devices: None. IMPRESSION: 1. No acute intrathoracic, intra-abdominal, intrapelvic traumatic injury. 2. No acute fracture or traumatic malalignment of the thoracic or lumbar spine. 3. Other imaging findings of potential clinical significance: Question cirrhotic morphology of the liver-recommend nonemergent outpatient right upper quadrant ultrasound. Colonic diverticulosis with no acute diverticulitis. Electronically Signed   By: Tish Frederickson M.D.   On: 04/11/2023 19:42   CT Head Wo Contrast  Result Date: 04/11/2023 CLINICAL DATA:  Head trauma, minor (Age >= 65y); Neck trauma (Age >= 65y) Pt to ED for chest pressure 5/10 since 2 days. Pt fell on Tuesday and since then has been having pain in whole body also. Also is having SOB when climbs stairs at home, 13 steps. When gets to top of stairs, is out of breath. EXAM: CT HEAD WITHOUT CONTRAST CT CERVICAL SPINE WITHOUT CONTRAST TECHNIQUE: Multidetector CT imaging of the head and cervical spine was performed following the standard protocol without intravenous contrast. Multiplanar CT image reconstructions of the cervical spine were also generated. RADIATION DOSE REDUCTION: This exam was performed according to the departmental dose-optimization program which includes automated exposure control, adjustment of  the mA and/or kV according to patient size and/or  use of iterative reconstruction technique. COMPARISON:  CT C-spine 10/16/2021 FINDINGS: CT HEAD FINDINGS Brain: No evidence of large-territorial acute infarction. No parenchymal hemorrhage. No mass lesion. No extra-axial collection. No mass effect or midline shift. No hydrocephalus. Basilar cisterns are patent. Vascular: No hyperdense vessel. Atherosclerotic calcifications are present within the cavernous internal carotid arteries. Skull: No acute fracture or focal lesion. Sinuses/Orbits: Paranasal sinuses and mastoid air cells are clear. The orbits are unremarkable. Other: None. CT CERVICAL SPINE FINDINGS Alignment: Normal. Skull base and vertebrae: Multilevel moderate degenerative changes spine. Moderate left C4-C5 osseous neural foraminal stenosis. No associated severe osseous neural foraminal or central canal stenosis. No acute fracture. No aggressive appearing focal osseous lesion or focal pathologic process. Soft tissues and spinal canal: No prevertebral fluid or swelling. No visible canal hematoma. Upper chest: Unremarkable. Other: None. IMPRESSION: 1. No acute intracranial abnormality. 2. No acute displaced fracture or traumatic listhesis of the cervical spine. Electronically Signed   By: Tish Frederickson M.D.   On: 04/11/2023 19:28   CT Cervical Spine Wo Contrast  Result Date: 04/11/2023 CLINICAL DATA:  Head trauma, minor (Age >= 65y); Neck trauma (Age >= 65y) Pt to ED for chest pressure 5/10 since 2 days. Pt fell on Tuesday and since then has been having pain in whole body also. Also is having SOB when climbs stairs at home, 13 steps. When gets to top of stairs, is out of breath. EXAM: CT HEAD WITHOUT CONTRAST CT CERVICAL SPINE WITHOUT CONTRAST TECHNIQUE: Multidetector CT imaging of the head and cervical spine was performed following the standard protocol without intravenous contrast. Multiplanar CT image reconstructions of the cervical spine were also generated. RADIATION DOSE REDUCTION: This exam was  performed according to the departmental dose-optimization program which includes automated exposure control, adjustment of the mA and/or kV according to patient size and/or use of iterative reconstruction technique. COMPARISON:  CT C-spine 10/16/2021 FINDINGS: CT HEAD FINDINGS Brain: No evidence of large-territorial acute infarction. No parenchymal hemorrhage. No mass lesion. No extra-axial collection. No mass effect or midline shift. No hydrocephalus. Basilar cisterns are patent. Vascular: No hyperdense vessel. Atherosclerotic calcifications are present within the cavernous internal carotid arteries. Skull: No acute fracture or focal lesion. Sinuses/Orbits: Paranasal sinuses and mastoid air cells are clear. The orbits are unremarkable. Other: None. CT CERVICAL SPINE FINDINGS Alignment: Normal. Skull base and vertebrae: Multilevel moderate degenerative changes spine. Moderate left C4-C5 osseous neural foraminal stenosis. No associated severe osseous neural foraminal or central canal stenosis. No acute fracture. No aggressive appearing focal osseous lesion or focal pathologic process. Soft tissues and spinal canal: No prevertebral fluid or swelling. No visible canal hematoma. Upper chest: Unremarkable. Other: None. IMPRESSION: 1. No acute intracranial abnormality. 2. No acute displaced fracture or traumatic listhesis of the cervical spine. Electronically Signed   By: Tish Frederickson M.D.   On: 04/11/2023 19:28   DG Chest 2 View  Result Date: 04/11/2023 CLINICAL DATA:  Chest pain and dyspnea with exertion. EXAM: CHEST - 2 VIEW COMPARISON:  Chest x-ray dated 02/12/2016 FINDINGS: Heart size and mediastinal contours are stable. Lungs are clear. No pleural effusion or pneumothorax is seen. Osseous structures about the chest are unremarkable. IMPRESSION: No active cardiopulmonary disease. No evidence of pneumonia or pulmonary edema. Electronically Signed   By: Bary Richard M.D.   On: 04/11/2023 17:32    Assessment  and Plan:   Brytni Dray is a 77 y.o. y/o female ***  Lucilla Lame, MD. Marval Regal    Note: This dictation was prepared with Dragon dictation along with smaller phrase technology. Any transcriptional errors that result from this process are unintentional.

## 2023-04-29 LAB — IRON,TIBC AND FERRITIN PANEL
Ferritin: 228 ng/mL — ABNORMAL HIGH (ref 15–150)
Iron Saturation: 15 % (ref 15–55)
Iron: 61 ug/dL (ref 27–139)
Total Iron Binding Capacity: 412 ug/dL (ref 250–450)
UIBC: 351 ug/dL (ref 118–369)

## 2023-04-29 LAB — CBC
Hematocrit: 40.3 % (ref 34.0–46.6)
Hemoglobin: 12.2 g/dL (ref 11.1–15.9)
MCH: 24.2 pg — ABNORMAL LOW (ref 26.6–33.0)
MCHC: 30.3 g/dL — ABNORMAL LOW (ref 31.5–35.7)
MCV: 80 fL (ref 79–97)
Platelets: 226 10*3/uL (ref 150–450)
RBC: 5.04 x10E6/uL (ref 3.77–5.28)
RDW: 26.9 % — ABNORMAL HIGH (ref 11.7–15.4)
WBC: 7.1 10*3/uL (ref 3.4–10.8)

## 2023-04-30 MED ORDER — FERROUS SULFATE 325 (65 FE) MG PO TBEC: 325.0000 mg | DELAYED_RELEASE_TABLET | Freq: Every day | ORAL | 2 refills | Status: AC

## 2023-04-30 NOTE — Addendum Note (Signed)
Addended by: Roena Malady on: 04/30/2023 05:54 PM   Modules accepted: Orders

## 2023-06-01 ENCOUNTER — Ambulatory Visit (INDEPENDENT_AMBULATORY_CARE_PROVIDER_SITE_OTHER): Payer: Medicare Other | Admitting: Dermatology

## 2023-06-01 VITALS — BP 109/65 | HR 60

## 2023-06-01 DIAGNOSIS — L820 Inflamed seborrheic keratosis: Secondary | ICD-10-CM | POA: Diagnosis not present

## 2023-06-01 DIAGNOSIS — R21 Rash and other nonspecific skin eruption: Secondary | ICD-10-CM

## 2023-06-01 DIAGNOSIS — L810 Postinflammatory hyperpigmentation: Secondary | ICD-10-CM | POA: Diagnosis not present

## 2023-06-01 DIAGNOSIS — L299 Pruritus, unspecified: Secondary | ICD-10-CM

## 2023-06-01 MED ORDER — MOMETASONE FUROATE 0.1 % EX CREA: TOPICAL_CREAM | CUTANEOUS | 0 refills | Status: AC

## 2023-06-01 NOTE — Progress Notes (Signed)
   Follow-Up Visit   Subjective  Holly Schneider is a 77 y.o. female who presents for the following: Rash on the arms x 2 months, came up after she was in the hospital for low iron. She has tried multiple OTC creams for itch, but nothing has helped. No change in medicines, soaps, laundry detergent. She also has a dark spot on the left arm that is sore, came up while in the hospital in April. She also wants a spot on the scalp checked.   Patient accompanied by daughter-in-law.  The following portions of the chart were reviewed this encounter and updated as appropriate: medications, allergies, medical history  Review of Systems:  No other skin or systemic complaints except as noted in HPI or Assessment and Plan.  Objective  Well appearing patient in no apparent distress; mood and affect are within normal limits.  A focused examination was performed of the following areas: Face, arms  Relevant physical exam findings are noted in the Assessment and Plan.  Left Forearm Hyperpigmented patch.  Right Zygomatic Area x 2 (2) Erythematous stuck-on, waxy papule    Assessment & Plan   Post-inflammatory hyperpigmentation Left Forearm  Resolving hematoma. Secondary to iron infusions.  Advised patient and daughter-in-law area should gradually fade over time.   Inflamed seborrheic keratosis (2) Right Zygomatic Area x 2  Symptomatic, irritating, patient would like treated.  Destruction of lesion - Right Zygomatic Area x 2  Destruction method: cryotherapy   Informed consent: discussed and consent obtained   Lesion destroyed using liquid nitrogen: Yes   Region frozen until ice ball extended beyond lesion: Yes   Outcome: patient tolerated procedure well with no complications   Post-procedure details: wound care instructions given   Additional details:  Prior to procedure, discussed risks of blister formation, small wound, skin dyspigmentation, or rare scar following cryotherapy.  Recommend Vaseline ointment to treated areas while healing.   Rash, Non-specific, with Pruritus  Exam: Healing excoriations on the arms with mild erythema.  Treatment Plan: Start mometasone cream Apply twice daily to arms x 2-4 weeks dsp 45g 0Rf. Topical steroids (such as triamcinolone, fluocinolone, fluocinonide, mometasone, clobetasol, halobetasol, betamethasone, hydrocortisone) can cause thinning and lightening of the skin if they are used for too long in the same area. Your physician has selected the right strength medicine for your problem and area affected on the body. Please use your medication only as directed by your physician to prevent side effects.    Recommend OTC Gold Bond Rapid Relief Anti-Itch cream (pramoxine + menthol), CeraVe Anti-itch cream or lotion (pramoxine), Sarna lotion (Original- menthol + camphor or Sensitive- pramoxine) or Eucerin 12 hour Itch Relief lotion (menthol) up to 3 times per day to areas on body that are itchy.     Return if symptoms worsen or fail to improve.  ICherlyn Labella, CMA, am acting as scribe for Willeen Niece, MD .   Documentation: I have reviewed the above documentation for accuracy and completeness, and I agree with the above.  Willeen Niece, MD

## 2023-06-01 NOTE — Patient Instructions (Addendum)
Recommend OTC Gold Bond Rapid Relief Anti-Itch cream (pramoxine + menthol), CeraVe Anti-itch cream or lotion (pramoxine), Sarna lotion (Original- menthol + camphor or Sensitive- pramoxine) or Eucerin 12 hour Itch Relief lotion (menthol) up to 3 times per day to areas on body that are itchy.   Start mometasone cream - Apply to rash on arms twice a day for 2-4 weeks.  Topical steroids (such as triamcinolone, fluocinolone, fluocinonide, mometasone, clobetasol, halobetasol, betamethasone, hydrocortisone) can cause thinning and lightening of the skin if they are used for too long in the same area. Your physician has selected the right strength medicine for your problem and area affected on the body. Please use your medication only as directed by your physician to prevent side effects.   Cryotherapy Aftercare  Wash gently with soap and water everyday.   Apply Vaseline and Band-Aid daily until healed.    Due to recent changes in healthcare laws, you may see results of your pathology and/or laboratory studies on MyChart before the doctors have had a chance to review them. We understand that in some cases there may be results that are confusing or concerning to you. Please understand that not all results are received at the same time and often the doctors may need to interpret multiple results in order to provide you with the best plan of care or course of treatment. Therefore, we ask that you please give [place] 2 business days to thoroughly review all your results before contacting the office for clarification. Should we see a critical lab result, you will be contacted sooner.   If You Need Anything After Your Visit  If you have any questions or concerns for your doctor, please call our main line at [phone] and press option 4 to reach your doctor's medical assistant. If no one answers, please leave a voicemail as directed and we will return your call as soon as possible. Messages left after 4 pm will be  answered the following business day.   You may also send [place] a message via MyChart. We typically respond to MyChart messages within 1-2 business days.  For prescription refills, please ask your pharmacy to contact our office. Our fax number is [phone].  If you have an urgent issue when the clinic is closed that cannot wait until the next business day, you can page your doctor at the number below.    Please note that while we do our best to be available for urgent issues outside of office hours, we are not available 24/7.   If you have an urgent issue and are unable to reach [place], you may choose to seek medical care at your doctor's office, retail clinic, urgent care center, or emergency room.  If you have a medical emergency, please immediately call 911 or go to the emergency department.  Pager Numbers  - Dr. Gwen Pounds: [phone]  - Dr. Neale Burly: [phone]  - Dr. Roseanne Reno: [phone]  In the event of inclement weather, please call our main line at [phone] for an update on the status of any delays or closures.  Dermatology Medication Tips: Please keep the boxes that topical medications come in in order to help keep track of the instructions about where and how to use these. Pharmacies typically print the medication instructions only on the boxes and not directly on the medication tubes.   If your medication is too expensive, please contact our office at [phone] option 4 or send [place] a message through MyChart.   We are unable to  tell what your co-pay for medications will be in advance as this is different depending on your insurance coverage. However, we may be able to find a substitute medication at lower cost or fill out paperwork to get insurance to cover a needed medication.   If a prior authorization is required to get your medication covered by your insurance company, please allow [place] 1-2 business days to complete this process.  Drug prices often vary depending on  where the prescription is filled and some pharmacies may offer cheaper prices.  The website www.goodrx.com contains coupons for medications through different pharmacies. The prices here do not account for what the cost may be with help from insurance (it may be cheaper with your insurance), but the website can give you the price if you did not use any insurance.  - You can print the associated coupon and take it with your prescription to the pharmacy.  - You may also stop by our office during regular business hours and pick up a GoodRx coupon card.  - If you need your prescription sent electronically to a different pharmacy, notify our office through [facility] or by phone at [phone] option 4.     Si Usted Necesita Algo Despus de Su Visita  Tambin puede enviarnos un mensaje a travs de Clinical cytogeneticist. Por lo general respondemos a los mensajes de MyChart en el transcurso de 1 a 2 das hbiles.  Para renovar recetas, por favor pida a su farmacia que se ponga en contacto con nuestra oficina. Annie Sable de fax es [place] [phone].  Si tiene un asunto urgente cuando la clnica est cerrada y que no puede esperar hasta el siguiente da hbil, puede llamar/localizar a su doctor(a) al nmero que aparece a continuacin.   Por favor, tenga en cuenta que aunque hacemos todo lo posible para estar disponibles para asuntos urgentes fuera del horario de [place], no estamos disponibles las 24 horas del da, los 7 [address]  [address] de la [place].   Si tiene un problema urgente y no puede comunicarse con nosotros, puede optar por buscar atencin mdica  en el consultorio de su doctor(a), en una clnica privada, en un centro de atencin urgente o en una sala de emergencias.  Si tiene Engineer, drilling, por favor llame inmediatamente al 911 o vaya a la sala de emergencias.  Nmeros de bper  - Dr. Gwen Pounds: [phone]  - Dra. Moye: [phone]  - Dra. Roseanne Reno: [phone]  En caso de inclemencias  del [place], por favor llame a Lacy Duverney principal al [phone] para una actualizacin sobre el [place] de cualquier retraso o cierre.  Consejos para la medicacin en dermatologa: Por favor, guarde las cajas en las que vienen los medicamentos de uso tpico para ayudarle a seguir las instrucciones sobre dnde y cmo usarlos. Las farmacias generalmente imprimen las instrucciones del medicamento slo en las cajas y no directamente en los tubos del [place].   Si su medicamento es muy caro, por favor, pngase en contacto con Rolm Gala llamando al [phone] y presione la opcin 4 o envenos un mensaje a travs de Clinical cytogeneticist.   No podemos decirle cul ser su copago por los medicamentos por adelantado ya que esto es diferente dependiendo de la cobertura de su seguro. Sin embargo, es posible que podamos encontrar un medicamento sustituto a Audiological scientist un formulario para que el seguro cubra el medicamento que se considera necesario.   Si se requiere una autorizacin previa para que su compaa de seguros [place]  su medicamento, por favor permtanos de 1 a 2 das hbiles para completar [address]  Los precios de los medicamentos varan con frecuencia dependiendo del Environmental consultant de dnde se surte la receta y alguna farmacias pueden ofrecer precios ms baratos.  El sitio web www.goodrx.com tiene cupones para medicamentos de Health and safety inspector. Los precios aqu no tienen en cuenta lo que podra costar con la ayuda del seguro (puede ser ms barato con su seguro), pero el sitio web puede darle el precio si no utiliz Tourist information centre manager.  - Puede imprimir el cupn correspondiente y llevarlo con su receta a la farmacia.  - Tambin puede pasar por nuestra oficina durante el horario de atencin regular y Education officer, museum una tarjeta de cupones de GoodRx.  - Si necesita que su receta se enve electrnicamente a una farmacia diferente, informe a nuestra oficina a travs de MyChart de [place] o por telfono  llamando al [phone] y presione la opcin 4.

## 2024-06-01 ENCOUNTER — Other Ambulatory Visit: Payer: Self-pay | Admitting: Physician Assistant

## 2024-06-01 DIAGNOSIS — R29898 Other symptoms and signs involving the musculoskeletal system: Secondary | ICD-10-CM

## 2024-06-01 DIAGNOSIS — M542 Cervicalgia: Secondary | ICD-10-CM

## 2024-06-01 DIAGNOSIS — M79601 Pain in right arm: Secondary | ICD-10-CM

## 2024-06-01 DIAGNOSIS — R200 Anesthesia of skin: Secondary | ICD-10-CM

## 2024-07-06 ENCOUNTER — Other Ambulatory Visit: Payer: Self-pay | Admitting: Nurse Practitioner

## 2024-07-06 ENCOUNTER — Encounter: Payer: Self-pay | Admitting: Physician Assistant

## 2024-07-06 DIAGNOSIS — K7460 Unspecified cirrhosis of liver: Secondary | ICD-10-CM

## 2024-07-27 ENCOUNTER — Ambulatory Visit
Admission: RE | Admit: 2024-07-27 | Discharge: 2024-07-27 | Disposition: A | Discharge status: Home / Self Care | Source: Ambulatory Visit | Attending: Physician Assistant | Admitting: Physician Assistant

## 2024-07-27 DIAGNOSIS — M79601 Pain in right arm: Secondary | ICD-10-CM | POA: Diagnosis present

## 2024-07-27 DIAGNOSIS — R202 Paresthesia of skin: Secondary | ICD-10-CM | POA: Insufficient documentation

## 2024-07-27 DIAGNOSIS — R29898 Other symptoms and signs involving the musculoskeletal system: Secondary | ICD-10-CM | POA: Insufficient documentation

## 2024-07-27 DIAGNOSIS — R200 Anesthesia of skin: Secondary | ICD-10-CM | POA: Diagnosis present

## 2024-07-27 DIAGNOSIS — M542 Cervicalgia: Secondary | ICD-10-CM | POA: Diagnosis present

## 2024-07-28 ENCOUNTER — Ambulatory Visit
Admission: RE | Admit: 2024-07-28 | Discharge: 2024-07-28 | Disposition: A | Discharge status: Home / Self Care | Source: Ambulatory Visit | Attending: Nurse Practitioner | Admitting: Nurse Practitioner

## 2024-07-28 DIAGNOSIS — K7460 Unspecified cirrhosis of liver: Secondary | ICD-10-CM | POA: Diagnosis present

## 2024-11-30 ENCOUNTER — Ambulatory Visit: Admitting: Dermatology
